# Patient Record
Sex: Male | Born: 1960 | Race: White | Hispanic: No | Marital: Married | Smoking: Former smoker
Health system: Southern US, Community
[De-identification: ages and names within clinical notes are randomized; demographics above are authoritative.]

## PROBLEM LIST (undated history)

## (undated) DIAGNOSIS — F329 Major depressive disorder, single episode, unspecified: Secondary | ICD-10-CM

## (undated) DIAGNOSIS — F32A Depression, unspecified: Secondary | ICD-10-CM

## (undated) DIAGNOSIS — F319 Bipolar disorder, unspecified: Secondary | ICD-10-CM

## (undated) HISTORY — DX: Depression, unspecified: F32.A

## (undated) HISTORY — DX: Major depressive disorder, single episode, unspecified: F32.9

---

## 2011-12-13 ENCOUNTER — Encounter: Payer: Self-pay | Admitting: *Deleted

## 2011-12-13 DIAGNOSIS — F329 Major depressive disorder, single episode, unspecified: Secondary | ICD-10-CM | POA: Insufficient documentation

## 2011-12-18 ENCOUNTER — Encounter: Payer: Self-pay | Admitting: Emergency Medicine

## 2011-12-18 ENCOUNTER — Ambulatory Visit (INDEPENDENT_AMBULATORY_CARE_PROVIDER_SITE_OTHER): Payer: BC Managed Care – PPO | Admitting: Emergency Medicine

## 2011-12-18 VITALS — BP 114/72 | HR 71 | Temp 97.9°F | Resp 16 | Ht 71.5 in | Wt 199.0 lb

## 2011-12-18 DIAGNOSIS — E049 Nontoxic goiter, unspecified: Secondary | ICD-10-CM

## 2011-12-18 DIAGNOSIS — E782 Mixed hyperlipidemia: Secondary | ICD-10-CM

## 2011-12-18 DIAGNOSIS — F329 Major depressive disorder, single episode, unspecified: Secondary | ICD-10-CM

## 2011-12-18 DIAGNOSIS — E785 Hyperlipidemia, unspecified: Secondary | ICD-10-CM

## 2011-12-18 DIAGNOSIS — E041 Nontoxic single thyroid nodule: Secondary | ICD-10-CM

## 2011-12-18 DIAGNOSIS — F339 Major depressive disorder, recurrent, unspecified: Secondary | ICD-10-CM

## 2011-12-18 LAB — LIPID PANEL
Cholesterol: 155 mg/dL (ref 0–200)
HDL: 49 mg/dL (ref >39)
LDL Cholesterol: 75 mg/dL (ref 0–99)
Triglycerides: 156 mg/dL — ABNORMAL HIGH (ref <150)

## 2011-12-18 LAB — CBC
HCT: 44 % (ref 39.0–52.0)
Platelets: 280 10*3/uL (ref 150–400)
RDW: 13 % (ref 11.5–15.5)
WBC: 5.5 10*3/uL (ref 4.0–10.5)

## 2011-12-18 MED ORDER — SIMVASTATIN 40 MG PO TABS: ORAL_TABLET | ORAL | Status: DC

## 2011-12-18 MED ORDER — RISPERIDONE 1 MG PO TABS: ORAL_TABLET | ORAL | Status: DC

## 2011-12-18 MED ORDER — DIVALPROEX SODIUM 500 MG PO DR TAB: DELAYED_RELEASE_TABLET | ORAL | Status: DC

## 2011-12-18 NOTE — Progress Notes (Signed)
**Note De-identified Mckelvie Obfuscation**  **Note De-Identified Binns Obfuscation** Subjective:    Patient ID: Jared Molina, male    DOB: Jun 14, 1961, 51 y.o.   MRN: [id number]  HPI patient is with a 6 month followup of his cholesterol. He overall is doing well without complaints    Review of Systems denies chest pain shortness of breath or any other issues at the present time.     Objective:   Physical Exam physical exam the neck is supple. There is a 1 x 1.5 cm nodular area which appears to be in the right lobe of the thyroid. Neck is otherwise supple chest clear heart regular rate no murmurs abdomen soft without tenderness.        Assessment & Plan:  Assessment is high cholesterol currently doing well on treatment. He was found to have a nodule in the area of the thyroid today and we'll check a ultrasound to delineate what this is.

## 2011-12-24 ENCOUNTER — Ambulatory Visit
Admission: RE | Admit: 2011-12-24 | Discharge: 2011-12-24 | Disposition: A | Discharge status: Home / Self Care | Payer: BC Managed Care – PPO | Source: Ambulatory Visit | Attending: Emergency Medicine | Admitting: Emergency Medicine

## 2011-12-24 ENCOUNTER — Other Ambulatory Visit: Payer: Self-pay | Admitting: Emergency Medicine

## 2011-12-24 DIAGNOSIS — E079 Disorder of thyroid, unspecified: Secondary | ICD-10-CM

## 2011-12-24 DIAGNOSIS — E041 Nontoxic single thyroid nodule: Secondary | ICD-10-CM

## 2011-12-25 ENCOUNTER — Other Ambulatory Visit (HOSPITAL_COMMUNITY)
Admission: RE | Admit: 2011-12-25 | Discharge: 2011-12-25 | Disposition: A | Discharge status: Home / Self Care | Payer: BC Managed Care – PPO | Source: Ambulatory Visit | Attending: Interventional Radiology | Admitting: Interventional Radiology

## 2011-12-25 ENCOUNTER — Other Ambulatory Visit: Payer: Self-pay | Admitting: Physician Assistant

## 2011-12-25 ENCOUNTER — Ambulatory Visit
Admission: RE | Admit: 2011-12-25 | Discharge: 2011-12-25 | Disposition: A | Discharge status: Home / Self Care | Payer: BC Managed Care – PPO | Source: Ambulatory Visit | Attending: Physician Assistant | Admitting: Physician Assistant

## 2011-12-25 ENCOUNTER — Telehealth: Payer: Self-pay | Admitting: Radiology

## 2011-12-25 DIAGNOSIS — E079 Disorder of thyroid, unspecified: Secondary | ICD-10-CM

## 2011-12-25 DIAGNOSIS — E049 Nontoxic goiter, unspecified: Secondary | ICD-10-CM | POA: Insufficient documentation

## 2011-12-25 NOTE — Telephone Encounter (Signed)
**Note De-Identified Gilkison Obfuscation** Message copied by Levon Hedger A on Tue Dec 25, 2011  9:18 AM ------      Message from: Lesle Chris A      Created: Mon Dec 24, 2011 10:31 AM       Please call patient. There is a nodule in the thyroid which needs to have a biopsy performed. We'll schedule with interventional radiology a biopsy of this lesion. If he has not heard from [place] by Thursday please contact our office so we can be sure the appointment has been made.

## 2011-12-25 NOTE — Telephone Encounter (Signed)
**Note De-Identified Albanese Obfuscation** INFORMED PT OF RESULTS. TOLD HIM WE WILL BE GETTING AN APPT MADE FOR HIM FOR BIOPSY.

## 2012-01-02 ENCOUNTER — Telehealth: Payer: Self-pay

## 2012-01-02 NOTE — Telephone Encounter (Signed)
**Note De-Identified Rotan Obfuscation** Thyroid biopsy result not in yet.  Patient notified that it can sometimes take 1-2 weeks for result, but we will notify him as soon as MD reviews.

## 2012-01-02 NOTE — Telephone Encounter (Signed)
**Note De-Identified Knudsen Obfuscation** PT STATES HE HAD A THYROID TEST DONE LAST WEEK AND WAS HOPING HE WOULD HAVE HEARD FROM SOMEONE BY NOW PLEASE CALL [phone]

## 2012-01-09 NOTE — Telephone Encounter (Signed)
**Note De-Identified Lumpkin Obfuscation** Pt called he is still waiting on results from his biopsy, he called yesterday as well and would like a return phone call form Dr Cleta Alberts,

## 2012-01-10 NOTE — Telephone Encounter (Signed)
**Note De-Identified Vlcek Obfuscation** Gave pt results and f/up instr's from Dr Cleta Alberts - see notes under lab results 4/2 biopsy. Pt agreed

## 2012-04-28 ENCOUNTER — Encounter: Payer: Self-pay | Admitting: Emergency Medicine

## 2012-06-17 ENCOUNTER — Ambulatory Visit: Payer: BC Managed Care – PPO | Admitting: Emergency Medicine

## 2012-08-26 ENCOUNTER — Encounter: Payer: Self-pay | Admitting: Emergency Medicine

## 2012-08-26 ENCOUNTER — Ambulatory Visit (INDEPENDENT_AMBULATORY_CARE_PROVIDER_SITE_OTHER): Payer: BC Managed Care – PPO | Admitting: Emergency Medicine

## 2012-08-26 ENCOUNTER — Ambulatory Visit: Payer: BC Managed Care – PPO

## 2012-08-26 VITALS — BP 110/72 | HR 71 | Temp 98.0°F | Resp 16 | Ht 71.5 in | Wt 205.2 lb

## 2012-08-26 DIAGNOSIS — E041 Nontoxic single thyroid nodule: Secondary | ICD-10-CM | POA: Insufficient documentation

## 2012-08-26 DIAGNOSIS — Z0000 Encounter for general adult medical examination without abnormal findings: Secondary | ICD-10-CM

## 2012-08-26 DIAGNOSIS — Z125 Encounter for screening for malignant neoplasm of prostate: Secondary | ICD-10-CM

## 2012-08-26 DIAGNOSIS — Z139 Encounter for screening, unspecified: Secondary | ICD-10-CM

## 2012-08-26 DIAGNOSIS — E785 Hyperlipidemia, unspecified: Secondary | ICD-10-CM | POA: Insufficient documentation

## 2012-08-26 DIAGNOSIS — Z9089 Acquired absence of other organs: Secondary | ICD-10-CM

## 2012-08-26 DIAGNOSIS — F17200 Nicotine dependence, unspecified, uncomplicated: Secondary | ICD-10-CM | POA: Insufficient documentation

## 2012-08-26 DIAGNOSIS — G4700 Insomnia, unspecified: Secondary | ICD-10-CM

## 2012-08-26 DIAGNOSIS — F329 Major depressive disorder, single episode, unspecified: Secondary | ICD-10-CM

## 2012-08-26 DIAGNOSIS — Z23 Encounter for immunization: Secondary | ICD-10-CM

## 2012-08-26 DIAGNOSIS — F1020 Alcohol dependence, uncomplicated: Secondary | ICD-10-CM | POA: Insufficient documentation

## 2012-08-26 LAB — POCT URINALYSIS DIPSTICK
Bilirubin, UA: NEGATIVE
Glucose, UA: NEGATIVE
Ketones, UA: NEGATIVE
Spec Grav, UA: >=1.03

## 2012-08-26 LAB — COMPREHENSIVE METABOLIC PANEL
Albumin: 4.3 g/dL (ref 3.5–5.2)
BUN: 12 mg/dL (ref 6–23)
Calcium: 9.1 mg/dL (ref 8.4–10.5)
Chloride: 104 mEq/L (ref 96–112)
Glucose, Bld: 83 mg/dL (ref 70–99)
Potassium: 4.6 mEq/L (ref 3.5–5.3)
Total Protein: 6.7 g/dL (ref 6.0–8.3)

## 2012-08-26 LAB — POCT UA - MICROSCOPIC ONLY
Bacteria, U Microscopic: NEGATIVE
Crystals, Ur, HPF, POC: NEGATIVE
RBC, urine, microscopic: NEGATIVE

## 2012-08-26 LAB — LIPID PANEL
Cholesterol: 145 mg/dL (ref 0–200)
LDL Cholesterol: 49 mg/dL (ref 0–99)
Triglycerides: 254 mg/dL — ABNORMAL HIGH (ref <150)

## 2012-08-26 LAB — CBC
HCT: 43.5 % (ref 39.0–52.0)
Hemoglobin: 14.9 g/dL (ref 13.0–17.0)
RBC: 4.81 MIL/uL (ref 4.22–5.81)
WBC: 5 10*3/uL (ref 4.0–10.5)

## 2012-08-26 LAB — IFOBT (OCCULT BLOOD): IFOBT: NEGATIVE

## 2012-08-26 MED ORDER — RISPERIDONE 1 MG PO TABS: ORAL_TABLET | ORAL | Status: DC

## 2012-08-26 MED ORDER — DIVALPROEX SODIUM 500 MG PO DR TAB: DELAYED_RELEASE_TABLET | ORAL | Status: DC

## 2012-08-26 MED ORDER — SIMVASTATIN 40 MG PO TABS: ORAL_TABLET | ORAL | Status: DC

## 2012-08-26 NOTE — Progress Notes (Signed)
**Note De-Identified Fusilier Obfuscation** @[facility] @  Patient ID: Jared Molina MRN: [id number], DOB: 1961-02-06 51 y.o. Date of Encounter: 08/26/2012, 8:15 AM  Primary Physician: No primary provider on file.  Chief Complaint: Physical (CPE)  HPI: 51 y.o. y/o male with history noted below here for CPE.  Doing well. No issues/complaints.  Review of Systems:  Consitutional: No fever, chills, fatigue, night sweats, lymphadenopathy,weight increasing . Eyes: No visual changes, eye redness, or discharge. ENT/Mouth: Ears: No otalgia, tinnitus, hearing loss, discharge. Nose: No congestion, rhinorrhea, sinus pain, or epistaxis. Throat: No sore throat, post nasal drip, or teeth pain. Cardiovascular: No CP, palpitations, diaphoresis, DOE, edema, orthopnea, PND. Respiratory: No cough, hemoptysis, SOB, or wheezing. Gastrointestinal: No anorexia, dysphagia, reflux, pain, nausea, vomiting, hematemesis, diarrhea, constipation, BRBPR, or melena. Genitourinary: No dysuria, frequency, urgency, hematuria, incontinence, nocturia, decreased urinary stream, discharge, impotence, or testicular pain/masses. Musculoskeletal: No decreased ROM, myalgias, stiffness, joint swelling, or weakness. Skin: No rash, erythema, lesion changes, pain, warmth, jaundice, or pruritis. Neurological: No headache, dizziness, syncope, seizures, tremors, memory loss, coordination problems, or paresthesias. Psychological: No anxiety, depression, hallucinations, SI/HI. Endocrine: No fatigue, polydipsia, polyphagia, polyuria, or known diabetes. All other systems were reviewed and are otherwise negative.  Past Medical History  Diagnosis Date  . Depression      No past surgical history on file.  Home Meds:  Prior to Admission medications   Medication Sig Start Date End Date Taking? Authorizing Provider  divalproex (DEPAKOTE) 500 MG DR tablet Take one tablet at bedtime. 12/18/11  Yes Collene Gobble, MD  risperiDONE (RISPERDAL) 1 MG tablet 1 tablet at bedtime 12/18/11  Yes Collene Gobble, MD  simvastatin (ZOCOR) 40 MG tablet Take one tablet by mouth every evening 12/18/11  Yes Collene Gobble, MD    Allergies: No Known Allergies  History   Social History  . Marital Status: Married    Spouse Name: N/A    Number of Children: N/A  . Years of Education: N/A   Occupational History  . Not on file.   Social History Main Topics  . Smoking status: Current Every Day Smoker  . Smokeless tobacco: Not on file  . Alcohol Use: Yes  . Drug Use: No  . Sexually Active: Not on file   Other Topics Concern  . Not on file   Social History Narrative  . No narrative on file    No family history on file.  Physical Exam:  Blood pressure 110/72, pulse 71, temperature 98 F (36.7 C), temperature source Oral, resp. rate 16, height 5' 11.5" (1.816 m), weight 205 lb 3.2 oz (93.078 kg), SpO2 97.00%.  General: Well developed, well nourished, in no acute distress. HEENT: Normocephalic, atraumatic. Conjunctiva pink, sclera non-icteric. Pupils 2 mm constricting to 1 mm, round, regular, and equally reactive to light and accomodation. EOMI. Internal auditory canal clear. The right TM is scarred with a pigmented area present in the superior portion of the drum. He's had a mastoidectomy on the right previously . Nasal mucosa pink. Nares are without discharge. No sinus tenderness. Oral mucosa pink. Dentition did not note any thyromegaly or thyroid nodules today  . Pharynx without exudate.   Neck: Supple. Trachea midline. No thyromegaly. Full ROM. No lymphadenopathy. Lungs: Clear to auscultation bilaterally without wheezes, rales, or rhonchi. Breathing is of normal effort and unlabored. Cardiovascular: RRR with S1 S2. No murmurs, rubs, or gallops appreciated. Distal pulses 2+ symmetrically. No carotid or abdominal bruits.  Abdomen: Soft, non-tender, non-distended with normoactive bowel sounds. No hepatosplenomegaly or masses. **Note De-Identified Dooling Obfuscation** No rebound/guarding. No CVA tenderness. Without hernias.  Rectal:  No external hemorrhoids or fissures. Rectal vault without masses.   Genitourinary:   circumcised male. No penile lesions. Testes descended bilaterally, and smooth without tenderness or masses.  Musculoskeletal: Full range of motion and 5/5 strength throughout. Without swelling, atrophy, tenderness, crepitus, or warmth. Extremities without clubbing, cyanosis, or edema. Calves supple. Skin: Warm and moist without erythema, ecchymosis, wounds, or rash. Neuro: A+Ox3. CN II-XII grossly intact. Moves all extremities spontaneously. Full sensation throughout. Normal gait. DTR 2+ throughout upper and lower extremities. Finger to nose intact. Psych:  Responds to questions appropriately with a normal affect.   Studies: CBC, CMET, Lipid, PSA, TSH,     Assessment/Plan:  51 y.o. y/o   male here for CPE he recently saw Dr. Jacinto Halim and had a stress test done last year.   . 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 He is currently on medications for depression and for high cholesterol. He continues to be a smoker he is trying to cut back on this some. He continues to drink more than he would like. He is trying to cut back and drink only on the weekends. He's been to AA in the past but not recently and does not really want to go back again . He declines a flu shot this year. He will consider shingles vaccine. He is agreeable to have a colonoscopy ordered [facility] reading (PRIMARY) by  Dr Cleta Alberts please comment on lateral to see if there is any abnormality noted anterior chest.   -  Signed, Earl Lites, MD 08/26/2012 8:15 AM

## 2012-11-18 ENCOUNTER — Other Ambulatory Visit: Payer: Self-pay | Admitting: Emergency Medicine

## 2012-12-19 ENCOUNTER — Other Ambulatory Visit: Payer: Self-pay | Admitting: Emergency Medicine

## 2013-09-20 ENCOUNTER — Other Ambulatory Visit: Payer: Self-pay | Admitting: Emergency Medicine

## 2013-10-19 ENCOUNTER — Telehealth: Payer: Self-pay | Admitting: Family Medicine

## 2013-10-19 MED ORDER — SIMVASTATIN 40 MG PO TABS: 40.0000 mg | ORAL_TABLET | Freq: Every day | ORAL | Status: DC

## 2013-10-19 MED ORDER — RISPERIDONE 1 MG PO TABS: 1.0000 mg | ORAL_TABLET | Freq: Every day | ORAL | Status: DC

## 2013-10-19 MED ORDER — DIVALPROEX SODIUM 500 MG PO DR TAB: 500.0000 mg | DELAYED_RELEASE_TABLET | Freq: Every day | ORAL | Status: DC

## 2013-10-19 NOTE — Telephone Encounter (Signed)
**Note De-Identified Fugere Obfuscation** Patient has appt 10/27/13 with Dr Cleta Albertsaub. Will send in 10 day supply.

## 2013-10-27 ENCOUNTER — Ambulatory Visit (INDEPENDENT_AMBULATORY_CARE_PROVIDER_SITE_OTHER): Payer: BC Managed Care – PPO | Admitting: Emergency Medicine

## 2013-10-27 ENCOUNTER — Encounter: Payer: Self-pay | Admitting: Emergency Medicine

## 2013-10-27 VITALS — BP 132/76 | HR 75 | Temp 98.9°F | Resp 16 | Ht 71.0 in | Wt 196.0 lb

## 2013-10-27 DIAGNOSIS — F329 Major depressive disorder, single episode, unspecified: Secondary | ICD-10-CM

## 2013-10-27 DIAGNOSIS — F3289 Other specified depressive episodes: Secondary | ICD-10-CM

## 2013-10-27 DIAGNOSIS — E041 Nontoxic single thyroid nodule: Secondary | ICD-10-CM

## 2013-10-27 DIAGNOSIS — F32A Depression, unspecified: Secondary | ICD-10-CM

## 2013-10-27 MED ORDER — RISPERIDONE 1 MG PO TABS: ORAL_TABLET | ORAL | Status: DC

## 2013-10-27 MED ORDER — SIMVASTATIN 40 MG PO TABS: ORAL_TABLET | ORAL | Status: DC

## 2013-10-27 MED ORDER — DIVALPROEX SODIUM 500 MG PO DR TAB: DELAYED_RELEASE_TABLET | ORAL | Status: DC

## 2013-10-27 NOTE — Progress Notes (Signed)
**Note De-identified Aydelott Obfuscation**   **Note De-Identified Mandujano Obfuscation** Subjective:    Patient ID: Jared Molina, male    DOB: 01-29-1961, 53 y.o.   MRN: [id number] This chart was scribed for Collene GobbleSteven A Eyan Hagood, MD by Danella Maiersaroline Early, ED Scribe. This patient was seen in room 21 and the patient's care was started at 2:33 PM.  Chief Complaint  Patient presents with  . Medication Refill    Depakote, Zocor, and Risperdal   HPI HPI Comments: Jared Molina is a 53 y.o. male who presents to the [facility] for a medication refill on his Depakote, Zocor, and Risperdal. He has been on these medications for 10 years. He has an upcoming appt here for a full physical. He does not want the flu shot. He was set up for an ultrasound and biopsy of a thyroid nodule 2 years ago but did not keep the appointment because of financial reasons.   He went to [place] and [place] in October on vacation. He works for a [employer], [employer] company.   Past Medical History  Diagnosis Date  . Depression    Current Outpatient Prescriptions on File Prior to Visit  Medication Sig Dispense Refill  . divalproex (DEPAKOTE) 500 MG DR tablet Take 1 tablet (500 mg total) by mouth at bedtime. PATIENT NEEDS OFFICE VISIT FOR ADDITIONAL REFILLS  10 tablet  0  . risperiDONE (RISPERDAL) 1 MG tablet Take 1 tablet (1 mg total) by mouth at bedtime. PATIENT NEEDS OFFICE VISIT FOR ADDITIONAL REFILLS  10 tablet  0  . simvastatin (ZOCOR) 40 MG tablet Take 1 tablet (40 mg total) by mouth daily. PATIENT NEEDS OFFICE VISIT FOR ADDITIONAL REFILLS  10 tablet  0   No current facility-administered medications on file prior to visit.   No Known Allergies    Review of Systems  Constitutional: Negative for fatigue and unexpected weight change.  Eyes: Negative for visual disturbance.  Respiratory: Negative for cough, chest tightness and shortness of breath.   Cardiovascular: Negative for chest pain, palpitations and leg swelling.  Gastrointestinal: Negative for abdominal pain and blood in stool.    Neurological: Negative for dizziness, light-headedness and headaches.       Objective:   Physical Exam CONSTITUTIONAL: Well developed/well nourished HEAD: Normocephalic/atraumatic EYES: EOMI/PERRL ENMT: Mucous membranes moist NECK: supple no meningeal signs SPINE:entire spine nontender CV: S1/S2 noted, no murmurs/rubs/gallops noted LUNGS: Lungs are clear to auscultation bilaterally, no apparent distress ABDOMEN: soft, nontender, no rebound or guarding GU:no cva tenderness NEURO: Pt is awake/alert, moves all extremitiesx4 EXTREMITIES: pulses normal, full ROM SKIN: warm, color normal PSYCH: no abnormalities of mood noted  Filed Vitals:   10/27/13 1443  BP: 132/76  Pulse: 75  Temp: 98.9 F (37.2 C)  TempSrc: Oral  Resp: 16  Height: 5\' 11"  (1.803 m)  Weight: 196 lb (88.905 kg)  SpO2: 98%         Assessment & Plan:  No diagnosis found. Patient needs meds refilled for his anxiety depression and for high cholesterol. He was scheduled for a thyroid biopsy but did not have it done. We'll repeat his thyroid ultrasound to check stability. He is scheduled for a physical exam in April.  I personally performed the services described in this documentation, which was scribed in my presence. The recorded information has been reviewed and is accurate.

## 2013-11-04 ENCOUNTER — Other Ambulatory Visit: Payer: BC Managed Care – PPO

## 2013-11-11 ENCOUNTER — Other Ambulatory Visit: Payer: BC Managed Care – PPO

## 2013-12-01 ENCOUNTER — Ambulatory Visit
Admission: RE | Admit: 2013-12-01 | Discharge: 2013-12-01 | Disposition: A | Discharge status: Home / Self Care | Payer: BC Managed Care – PPO | Source: Ambulatory Visit | Attending: Emergency Medicine | Admitting: Emergency Medicine

## 2013-12-01 DIAGNOSIS — E041 Nontoxic single thyroid nodule: Secondary | ICD-10-CM

## 2013-12-29 ENCOUNTER — Ambulatory Visit (INDEPENDENT_AMBULATORY_CARE_PROVIDER_SITE_OTHER): Payer: BC Managed Care – PPO | Admitting: Emergency Medicine

## 2013-12-29 ENCOUNTER — Ambulatory Visit: Payer: BC Managed Care – PPO

## 2013-12-29 ENCOUNTER — Encounter: Payer: Self-pay | Admitting: Emergency Medicine

## 2013-12-29 VITALS — BP 108/66 | HR 61 | Temp 98.5°F | Resp 16 | Ht 71.0 in | Wt 195.2 lb

## 2013-12-29 DIAGNOSIS — F3289 Other specified depressive episodes: Secondary | ICD-10-CM

## 2013-12-29 DIAGNOSIS — F17200 Nicotine dependence, unspecified, uncomplicated: Secondary | ICD-10-CM

## 2013-12-29 DIAGNOSIS — F329 Major depressive disorder, single episode, unspecified: Secondary | ICD-10-CM

## 2013-12-29 DIAGNOSIS — Z0000 Encounter for general adult medical examination without abnormal findings: Secondary | ICD-10-CM

## 2013-12-29 DIAGNOSIS — F32A Depression, unspecified: Secondary | ICD-10-CM

## 2013-12-29 DIAGNOSIS — Z23 Encounter for immunization: Secondary | ICD-10-CM

## 2013-12-29 DIAGNOSIS — R634 Abnormal weight loss: Secondary | ICD-10-CM

## 2013-12-29 LAB — COMPLETE METABOLIC PANEL WITH GFR
ALT: 30 U/L (ref 0–53)
AST: 26 U/L (ref 0–37)
Albumin: 4.5 g/dL (ref 3.5–5.2)
Alkaline Phosphatase: 39 U/L (ref 39–117)
BILIRUBIN TOTAL: 0.6 mg/dL (ref 0.2–1.2)
BUN: 11 mg/dL (ref 6–23)
CO2: 28 meq/L (ref 19–32)
CREATININE: 0.87 mg/dL (ref 0.50–1.35)
Calcium: 9.5 mg/dL (ref 8.4–10.5)
Chloride: 103 mEq/L (ref 96–112)
GLUCOSE: 93 mg/dL (ref 70–99)
Potassium: 5.2 mEq/L (ref 3.5–5.3)
Sodium: 140 mEq/L (ref 135–145)
Total Protein: 6.7 g/dL (ref 6.0–8.3)

## 2013-12-29 LAB — CBC WITH DIFFERENTIAL/PLATELET
Basophils Absolute: 0 10*3/uL (ref 0.0–0.1)
Basophils Relative: 0 % (ref 0–1)
EOS ABS: 0 10*3/uL (ref 0.0–0.7)
EOS PCT: 1 % (ref 0–5)
HCT: 42.5 % (ref 39.0–52.0)
HEMOGLOBIN: 14.9 g/dL (ref 13.0–17.0)
LYMPHS ABS: 1.2 10*3/uL (ref 0.7–4.0)
Lymphocytes Relative: 31 % (ref 12–46)
MCH: 30.9 pg (ref 26.0–34.0)
MCHC: 35.1 g/dL (ref 30.0–36.0)
MCV: 88.2 fL (ref 78.0–100.0)
MONOS PCT: 13 % — AB (ref 3–12)
Monocytes Absolute: 0.5 10*3/uL (ref 0.1–1.0)
Neutro Abs: 2.1 10*3/uL (ref 1.7–7.7)
Neutrophils Relative %: 55 % (ref 43–77)
PLATELETS: 289 10*3/uL (ref 150–400)
RBC: 4.82 MIL/uL (ref 4.22–5.81)
RDW: 13.8 % (ref 11.5–15.5)
WBC: 3.9 10*3/uL — ABNORMAL LOW (ref 4.0–10.5)

## 2013-12-29 LAB — IFOBT (OCCULT BLOOD): IFOBT: NEGATIVE

## 2013-12-29 LAB — POCT URINALYSIS DIPSTICK
Bilirubin, UA: NEGATIVE
GLUCOSE UA: NEGATIVE
Ketones, UA: NEGATIVE
Leukocytes, UA: NEGATIVE
Nitrite, UA: NEGATIVE
PH UA: 7
RBC UA: NEGATIVE
SPEC GRAV UA: 1.015
UROBILINOGEN UA: 0.2

## 2013-12-29 LAB — LIPID PANEL
CHOLESTEROL: 147 mg/dL (ref 0–200)
HDL: 52 mg/dL (ref >39)
LDL CALC: 73 mg/dL (ref 0–99)
TRIGLYCERIDES: 110 mg/dL (ref <150)
Total CHOL/HDL Ratio: 2.8 Ratio
VLDL: 22 mg/dL (ref 0–40)

## 2013-12-29 MED ORDER — ZOSTER VACCINE LIVE 19400 UNT/0.65ML ~~LOC~~ SOLR: 0.6500 mL | Freq: Once | SUBCUTANEOUS | Status: DC

## 2013-12-29 NOTE — Progress Notes (Signed)
**Note De-identified Roa Obfuscation**  **Note De-Identified Thayer Obfuscation** This chart was scribed for Collene GobbleSteven A Jalysa Swopes, MD by Joaquin MusicKristina Sanchez-Matthews, ED Scribe. This patient was seen in room Room/bed 21 and the patient's care was started at 2:58 PM. Subjective:    Patient ID: Jared Inglesavid Molina, male    DOB: Jun 26, 1961, 53 y.o.   MRN: [id number] Chief Complaint  Patient presents with  . Annual Exam   HPI Jared InglesDavid Molina is a 53 y.o. male with a hx of depression who [facility] complaining of annual exam. Pt states his health has been improving since beginning weight Watchers January 2014. He states he lost about 20 lbs last year but gained 8 lbs over the holidays. He states he has a goal "to be 180-lbs". Pt states he is walking as well. He states he quit smoking the pipe and states he feels his health is improving. Pt reports to be working on his diet. He reports to be drinking 1-2 alcoholic beverages/beers x day. Pt states his last coloscopy was December 2014 and results WNL. 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 He states hearing loss "gets frustrating".Pt is being seen by Dr. Donnal Moatroust. Pts last tetanus was September 2012. He is unsure if he has had the shingles vaccine.  He reports to have his depression affect him in season. Pt states last year was the first time he took a week and a half off from work and states this helped with his depression.Pt is taking Depakote, Risperdal, and Zocor suggestive of pt being seen by psychiatrist.    Review of Systems  HENT: Positive for hearing loss.   Psychiatric/Behavioral: Positive for dysphoric mood.  All other systems reviewed and are negative.   Objective:   Physical Exam CONSTITUTIONAL: Well developed/well nourished. Flat affect.  HEAD: Normocephalic/atraumatic EYES: EOMI/PERRL ENMT: Mucous membranes moist. Scar over the R mastoid with distortion of the R TM structure. NECK: supple no meningeal signs SPINE:entire spine nontender CV: S1/S2 noted, no  murmurs/rubs/gallops noted LUNGS: Lungs are clear to auscultation bilaterally, no apparent distress ABDOMEN: soft, nontender, no rebound or guarding GU:no cva tenderness NEURO: Pt is awake/alert, moves all extremitiesx4 EXTREMITIES: pulses normal, full ROM SKIN: warm, color normal PSYCH: no abnormalities of mood noted  EKG: normal EKG, normal sinus rhythm.  [facility] preliminary x-ray report read by Dr. Lesle ChrisSteven Jashley Yellin: 3 x 1 cm wedge shaped infiltrate at R lower lobe. Please comment on. Need for F/U.  Triage Vitals:BP 108/66  Pulse 61  Temp(Src) 98.5 F (36.9 C) (Oral)  Resp 16  Ht 5\' 11"  (1.803 m)  Wt 195 lb 3.2 oz (88.542 kg)  BMI 27.24 kg/m2  SpO2 97% Assessment & Plan:  There is a questionable abnormality in the right lower lobe. Advise the patient at a minimum we would re\re x-ray this in 6 months. I'm awaiting the radiology reading on this area. Routine labs were done. He was given a prescription for shingles vaccine. No changes were made in his medications. He is going to decide if he wants surgery on his middle ear with Dr. Dorma RussellKraus. He will also need a followup ultrasound of his thyroid he has also had a thorough checkup with Dr. Jacinto HalimGanji. I personally performed the services described in this documentation, which was scribed in my presence. The recorded information has been reviewed and is accurate.

## 2013-12-30 LAB — PSA: PSA: 0.66 ng/mL (ref <=4.00)

## 2013-12-30 LAB — TSH: TSH: 1.907 u[IU]/mL (ref 0.350–4.500)

## 2014-01-02 ENCOUNTER — Telehealth: Payer: Self-pay | Admitting: Emergency Medicine

## 2014-01-02 NOTE — Telephone Encounter (Signed)
**Note De-Identified Bumpus Obfuscation** Cxr results left on voice mail

## 2014-01-05 ENCOUNTER — Encounter: Payer: Self-pay | Admitting: Radiology

## 2014-03-16 ENCOUNTER — Ambulatory Visit (INDEPENDENT_AMBULATORY_CARE_PROVIDER_SITE_OTHER): Payer: BC Managed Care – PPO | Admitting: Emergency Medicine

## 2014-03-16 ENCOUNTER — Encounter: Payer: Self-pay | Admitting: Emergency Medicine

## 2014-03-16 ENCOUNTER — Ambulatory Visit (INDEPENDENT_AMBULATORY_CARE_PROVIDER_SITE_OTHER): Payer: BC Managed Care – PPO

## 2014-03-16 VITALS — BP 126/75 | HR 75 | Temp 98.8°F | Resp 16 | Ht 71.25 in | Wt 195.8 lb

## 2014-03-16 DIAGNOSIS — R9389 Abnormal findings on diagnostic imaging of other specified body structures: Secondary | ICD-10-CM

## 2014-03-16 NOTE — Progress Notes (Signed)
**Note De-identified Anstey Obfuscation**   **Note De-Identified Kirkendoll Obfuscation** Subjective:    Patient ID: Jared Molina, male    DOB: [birth year], 53 y.o.   MRN: [id number]  HPI patient followup abnormal chest x-ray. He had some atelectatic areas in the right base and I requested he return to clinic today for reevaluation. He feels fine without complaints the    Review of Systems     Objective:   Physical Exam HEENT exam is unremarkable neck supple chest was clear to both auscultation and percussion [facility] reading (PRIMARY) by  Dr Cleta Albertsaub chest x-ray shows no acute disease no abnormal areas        Assessment & Plan:  Chest x-ray sent for radiology opinion. Depending on the findings we'll decide on need for further followup.

## 2014-09-29 ENCOUNTER — Other Ambulatory Visit: Payer: Self-pay | Admitting: Emergency Medicine

## 2014-09-30 NOTE — Telephone Encounter (Signed)
**Note De-Identified Jared Molina** Dr Cleta Albertsaub, pt has CPE scheduled w/you on 12/30/14. Do you want to OK RFs of these until then? Pended.

## 2014-12-21 ENCOUNTER — Other Ambulatory Visit: Payer: Self-pay | Admitting: Emergency Medicine

## 2014-12-22 ENCOUNTER — Other Ambulatory Visit: Payer: Self-pay | Admitting: Emergency Medicine

## 2014-12-27 ENCOUNTER — Telehealth: Payer: Self-pay | Admitting: Family Medicine

## 2014-12-27 NOTE — Telephone Encounter (Signed)
**Note De-Identified Waldren Obfuscation** called patient to moved his appt to 12/31/14

## 2014-12-30 ENCOUNTER — Encounter: Payer: BC Managed Care – PPO | Admitting: Emergency Medicine

## 2014-12-31 ENCOUNTER — Encounter: Payer: Self-pay | Admitting: Emergency Medicine

## 2015-01-18 ENCOUNTER — Telehealth: Payer: Self-pay

## 2015-01-18 MED ORDER — DIVALPROEX SODIUM 500 MG PO DR TAB: 500.0000 mg | DELAYED_RELEASE_TABLET | Freq: Every day | ORAL | Status: DC

## 2015-01-18 MED ORDER — SIMVASTATIN 40 MG PO TABS: 40.0000 mg | ORAL_TABLET | Freq: Every day | ORAL | Status: DC

## 2015-01-18 MED ORDER — RISPERIDONE 1 MG PO TABS: 1.0000 mg | ORAL_TABLET | Freq: Every day | ORAL | Status: DC

## 2015-01-18 NOTE — Telephone Encounter (Signed)
**Note De-identified Mussell Obfuscation** Can we refill meds

## 2015-01-18 NOTE — Telephone Encounter (Signed)
**Note De-Identified Swartout Obfuscation** Left message on pt's voicemail, advised Rx's sent to the pharmacy.

## 2015-01-18 NOTE — Telephone Encounter (Signed)
**Note De-Identified Ballo Obfuscation** I will refill this for him. Patient had normal Cmet in early 12/2014. Please let him know prescriptions were sent electronically to his pharmacy.

## 2015-01-18 NOTE — Telephone Encounter (Signed)
**Note De-Identified Paschen Obfuscation** Pt states he have an appt with Dr.Daub in July but will run out of his DEPAKOTE 500 MG, RISPERDAL 1 MG AND ZOCOR 40 MG. Would like to have enough until then Please call [phone]     [facility] ON [address]

## 2015-01-21 ENCOUNTER — Other Ambulatory Visit: Payer: Self-pay | Admitting: Emergency Medicine

## 2015-03-25 ENCOUNTER — Other Ambulatory Visit: Payer: Self-pay | Admitting: Urgent Care

## 2015-04-05 ENCOUNTER — Ambulatory Visit (INDEPENDENT_AMBULATORY_CARE_PROVIDER_SITE_OTHER): Payer: 59 | Admitting: Emergency Medicine

## 2015-04-05 ENCOUNTER — Encounter: Payer: Self-pay | Admitting: Emergency Medicine

## 2015-04-05 VITALS — BP 121/75 | HR 71 | Temp 97.9°F | Resp 16 | Ht 72.5 in | Wt 199.0 lb

## 2015-04-05 DIAGNOSIS — F329 Major depressive disorder, single episode, unspecified: Secondary | ICD-10-CM

## 2015-04-05 DIAGNOSIS — E785 Hyperlipidemia, unspecified: Secondary | ICD-10-CM | POA: Diagnosis not present

## 2015-04-05 DIAGNOSIS — Z7289 Other problems related to lifestyle: Secondary | ICD-10-CM | POA: Diagnosis not present

## 2015-04-05 DIAGNOSIS — F32A Depression, unspecified: Secondary | ICD-10-CM

## 2015-04-05 DIAGNOSIS — Z1339 Encounter for screening examination for other mental health and behavioral disorders: Secondary | ICD-10-CM

## 2015-04-05 DIAGNOSIS — Z720 Tobacco use: Secondary | ICD-10-CM | POA: Diagnosis not present

## 2015-04-05 DIAGNOSIS — E041 Nontoxic single thyroid nodule: Secondary | ICD-10-CM

## 2015-04-05 DIAGNOSIS — F17200 Nicotine dependence, unspecified, uncomplicated: Secondary | ICD-10-CM

## 2015-04-05 DIAGNOSIS — Z0000 Encounter for general adult medical examination without abnormal findings: Secondary | ICD-10-CM

## 2015-04-05 DIAGNOSIS — Z1389 Encounter for screening for other disorder: Secondary | ICD-10-CM | POA: Diagnosis not present

## 2015-04-05 LAB — POCT UA - MICROSCOPIC ONLY
CASTS, UR, LPF, POC: NEGATIVE
Crystals, Ur, HPF, POC: NEGATIVE
Epithelial cells, urine per micros: NEGATIVE
Mucus, UA: POSITIVE
WBC, Ur, HPF, POC: NEGATIVE
Yeast, UA: NEGATIVE

## 2015-04-05 LAB — POCT URINALYSIS DIPSTICK
Bilirubin, UA: NEGATIVE
Blood, UA: NEGATIVE
GLUCOSE UA: NEGATIVE
Ketones, UA: 15
Leukocytes, UA: NEGATIVE
Nitrite, UA: NEGATIVE
PROTEIN UA: NEGATIVE
Spec Grav, UA: 1.015
UROBILINOGEN UA: 0.2
pH, UA: 7

## 2015-04-05 LAB — TSH: TSH: 1.805 u[IU]/mL (ref 0.350–4.500)

## 2015-04-05 LAB — CBC WITH DIFFERENTIAL/PLATELET
BASOS PCT: 0 % (ref 0–1)
Basophils Absolute: 0 10*3/uL (ref 0.0–0.1)
Eosinophils Absolute: 0 10*3/uL (ref 0.0–0.7)
Eosinophils Relative: 0 % (ref 0–5)
HEMATOCRIT: 45.3 % (ref 39.0–52.0)
HEMOGLOBIN: 15.4 g/dL (ref 13.0–17.0)
Lymphocytes Relative: 34 % (ref 12–46)
Lymphs Abs: 1.5 10*3/uL (ref 0.7–4.0)
MCH: 30.7 pg (ref 26.0–34.0)
MCHC: 34 g/dL (ref 30.0–36.0)
MCV: 90.4 fL (ref 78.0–100.0)
MONO ABS: 0.4 10*3/uL (ref 0.1–1.0)
MPV: 10.4 fL (ref 8.6–12.4)
Monocytes Relative: 9 % (ref 3–12)
NEUTROS PCT: 57 % (ref 43–77)
Neutro Abs: 2.6 10*3/uL (ref 1.7–7.7)
Platelets: 249 10*3/uL (ref 150–400)
RBC: 5.01 MIL/uL (ref 4.22–5.81)
RDW: 13.4 % (ref 11.5–15.5)
WBC: 4.5 10*3/uL (ref 4.0–10.5)

## 2015-04-05 LAB — COMPLETE METABOLIC PANEL WITH GFR
ALK PHOS: 46 U/L (ref 39–117)
ALT: 26 U/L (ref 0–53)
AST: 16 U/L (ref 0–37)
Albumin: 4.3 g/dL (ref 3.5–5.2)
BILIRUBIN TOTAL: 0.6 mg/dL (ref 0.2–1.2)
BUN: 16 mg/dL (ref 6–23)
CALCIUM: 9.4 mg/dL (ref 8.4–10.5)
CO2: 21 mEq/L (ref 19–32)
CREATININE: 0.86 mg/dL (ref 0.50–1.35)
Chloride: 101 mEq/L (ref 96–112)
GFR, Est African American: >89 mL/min
GFR, Est Non African American: >89 mL/min
Glucose, Bld: 89 mg/dL (ref 70–99)
POTASSIUM: 4.7 meq/L (ref 3.5–5.3)
SODIUM: 134 meq/L — AB (ref 135–145)
Total Protein: 6.8 g/dL (ref 6.0–8.3)

## 2015-04-05 LAB — LIPID PANEL
CHOL/HDL RATIO: 2.8 ratio
Cholesterol: 139 mg/dL (ref 0–200)
HDL: 50 mg/dL (ref >=40)
LDL CALC: 64 mg/dL (ref 0–99)
Triglycerides: 123 mg/dL (ref <150)
VLDL: 25 mg/dL (ref 0–40)

## 2015-04-05 MED ORDER — SIMVASTATIN 40 MG PO TABS: ORAL_TABLET | ORAL | Status: DC

## 2015-04-05 MED ORDER — RISPERIDONE 1 MG PO TABS: ORAL_TABLET | ORAL | Status: DC

## 2015-04-05 MED ORDER — DIVALPROEX SODIUM 500 MG PO DR TAB: DELAYED_RELEASE_TABLET | ORAL | Status: DC

## 2015-04-05 NOTE — Patient Instructions (Signed)
**Note De-identified [place]k Obfuscation** Health Maintenance A healthy lifestyle and preventative care can promote health and wellness.  Maintain regular health, dental, and eye exams.  Eat a healthy diet. Foods like vegetables, fruits, whole grains, low-fat dairy products, and lean protein foods contain the nutrients you need and are low in calories. Decrease your intake of foods high in solid fats, added sugars, and salt. Get information about a proper diet from your health care provider, if necessary.  Regular physical exercise is one of the most important things you can do for your health. Most adults should get at least 150 minutes of moderate-intensity exercise (any activity that increases your heart rate and causes you to sweat) each week. In addition, most adults need muscle-strengthening exercises on 2 or more days a week.   Maintain a healthy weight. The body mass index (BMI) is a screening tool to identify possible weight problems. It provides an estimate of body fat based on height and weight. Your health care provider can find your BMI and can help you achieve or maintain a healthy weight. For males 20 years and older:  A BMI below 18.5 is considered underweight.  A BMI of 18.5 to 24.9 is normal.  A BMI of 25 to 29.9 is considered overweight.  A BMI of 30 and above is considered obese.  Maintain normal blood lipids and cholesterol by exercising and minimizing your intake of saturated fat. Eat a balanced diet with plenty of fruits and vegetables. Blood tests for lipids and cholesterol should begin at age 20 and be repeated every 5 years. If your lipid or cholesterol levels are high, you are over age 50, or you are at high risk for heart disease, you may need your cholesterol levels checked more frequently.Ongoing high lipid and cholesterol levels should be treated with medicines if diet and exercise are not working.  If you smoke, find out from your health care provider how to quit. If you do not use tobacco, do not  start.  Lung cancer screening is recommended for adults aged 55-80 years who are at high risk for developing lung cancer because of a history of smoking. A yearly low-dose CT scan of the lungs is recommended for people who have at least a 30-pack-year history of smoking and are current smokers or have quit within the past 15 years. A pack year of smoking is smoking an average of 1 pack of cigarettes a day for 1 year (for example, a 30-pack-year history of smoking could mean smoking 1 pack a day for 30 years or 2 packs a day for 15 years). Yearly screening should continue until the smoker has stopped smoking for at least 15 years. Yearly screening should be stopped for people who develop a health problem that would prevent them from having lung cancer treatment.  If you choose to drink alcohol, do not have more than 2 drinks per day. One drink is considered to be 12 oz (360 mL) of beer, 5 oz (150 mL) of wine, or 1.5 oz (45 mL) of liquor.  Avoid the use of street drugs. Do not share needles with anyone. Ask for help if you need support or instructions about stopping the use of drugs.  High blood pressure causes heart disease and increases the risk of stroke. Blood pressure should be checked at least every 1-2 years. Ongoing high blood pressure should be treated with medicines if weight loss and exercise are not effective.  If you are 45-79 years old, ask your health care provider if  **Note De-identified Sc[place]r Obfuscation** you should take aspirin to prevent heart disease.  Diabetes screening involves taking a blood sample to check your fasting blood sugar level. This should be done once every 3 years after age 45 if you are at a normal weight and without risk factors for diabetes. Testing should be considered at a younger age or be carried out more frequently if you are overweight and have at least 1 risk factor for diabetes.  Colorectal cancer can be detected and often prevented. Most routine colorectal cancer screening begins at the age of 50  and continues through age 75. However, your health care provider may recommend screening at an earlier age if you have risk factors for colon cancer. On a yearly basis, your health care provider may provide home test kits to check for hidden blood in the stool. A small camera at the end of a tube may be used to directly examine the colon (sigmoidoscopy or colonoscopy) to detect the earliest forms of colorectal cancer. Talk to your health care provider about this at age 50 when routine screening begins. A direct exam of the colon should be repeated every 5-10 years through age 75, unless early forms of precancerous polyps or small growths are found.  People who are at an increased risk for hepatitis B should be screened for this virus. You are considered at high risk for hepatitis B if:  You were born in a country where hepatitis B occurs often. Talk with your health care provider about which countries are considered high risk.  Your parents were born in a high-risk country and you have not received a shot to protect against hepatitis B (hepatitis B vaccine).  You have HIV or AIDS.  You use needles to inject street drugs.  You live with, or have sex with, someone who has hepatitis B.  You are a man who has sex with other men (MSM).  You get hemodialysis treatment.  You take certain medicines for conditions like cancer, organ transplantation, and autoimmune conditions.  Hepatitis C blood testing is recommended for all people born from 1945 through 1965 and any individual with known risk factors for hepatitis C.  Healthy men should no longer receive prostate-specific antigen (PSA) blood tests as part of routine cancer screening. Talk to your health care provider about prostate cancer screening.  Testicular cancer screening is not recommended for adolescents or adult males who have no symptoms. Screening includes self-exam, a health care provider exam, and other screening tests. Consult with your  health care provider about any symptoms you have or any concerns you have about testicular cancer.  Practice safe sex. Use condoms and avoid high-risk sexual practices to reduce the spread of sexually transmitted infections (STIs).  You should be screened for STIs, including gonorrhea and chlamydia if:  You are sexually active and are younger than 24 years.  You are older than 24 years, and your health care provider tells you that you are at risk for this type of infection.  Your sexual activity has changed since you were last screened, and you are at an increased risk for chlamydia or gonorrhea. Ask your health care provider if you are at risk.  If you are at risk of being infected with HIV, it is recommended that you take a prescription medicine daily to prevent HIV infection. This is called pre-exposure prophylaxis (PrEP). You are considered at risk if:  You are a man who has sex with other men (MSM).  You are a heterosexual man who  **Note De-identified Scarlata Obfuscation** is sexually active with multiple partners.  You take drugs by injection.  You are sexually active with a partner who has HIV.  Talk with your health care provider about whether you are at high risk of being infected with HIV. If you choose to begin PrEP, you should first be tested for HIV. You should then be tested every 3 months for as long as you are taking PrEP.  Use sunscreen. Apply sunscreen liberally and repeatedly throughout the day. You should seek shade when your shadow is shorter than you. Protect yourself by wearing long sleeves, pants, a wide-brimmed hat, and sunglasses year round whenever you are outdoors.  Tell your health care provider of new moles or changes in moles, especially if there is a change in shape or color. Also, tell your health care provider if a mole is larger than the size of a pencil eraser.  A one-time screening for abdominal aortic aneurysm (AAA) and surgical repair of large AAAs by ultrasound is recommended for men aged  65-75 years who are current or former smokers.  Stay current with your vaccines (immunizations). Document Released: 03/08/2008 Document Revised: 09/15/2013 Document Reviewed: 02/05/2011 ExitCare Patient Information 2015 [employer]. This information is not intended to replace advice given to you by your health care provider. Make sure you discuss any questions you have with your health care provider.  

## 2015-04-05 NOTE — Progress Notes (Addendum)
**Note De-Identified [place] Obfuscation** Subjective:  This chart was scribed for Lesle ChrisSteven Cydney Alvarenga, MD by Broadus Johnawaa Al Rifaie, Medical Scribe. This patient was seen in Room 22 and the patient's care was started at 8:38 AM.   Patient ID: Jared Molina, male    DOB: October 13, 1960, 54 y.o.   MRN: [id number]  HPI HPI Comments: [name] Kuch is a 54 y.o. male with a PMHx of depression and HLD, who presents to [facility] for a complete physical exam.   Pt states that he quit smoking tobacco in February of last year, however he started doing vapor cigarette later that year, in which has gradually decreased in its nicotine dosage. Today he is on the lowest dosage present. Pt indicates that he feels that he has been feeling better since stopping tobacco. He believes that his body cannot go without some nicotine in his system, and he shows concern for this problem.   Pt notes that he has been having much going on in his personal life. He states that he has just received a new job offer that he will be starting next week. The job is going to be work from [employer]. Pt believes that this job has much more potential than his previous one, and he is very excited to start it. Pt also indicates that his wife was laid off her last job, and is now looking to be in EMT school. He notes that his family has just moved to a new house in [place].    Review of Systems  All other systems reviewed and are negative.      Objective:   Physical Exam  Constitutional: He is oriented to person, place, and time. He appears well-developed and well-nourished. No distress.  HENT:  Head: Normocephalic and atraumatic.  Left Ear: External ear normal.  Mouth/Throat: Oropharynx is clear and moist. No oropharyngeal exudate.  On the right ear evident of previous surgery, there is a scar over the right mastoid.   Eyes: EOM are normal. Pupils are equal, round, and reactive to light.  Neck: Neck supple.  Cardiovascular: Normal rate and regular rhythm.  Exam reveals no  gallop and no friction rub.   No murmur heard. Pulmonary/Chest: Effort normal and breath sounds normal. No respiratory distress. He has no wheezes. He has no rales.  Musculoskeletal: Normal range of motion. He exhibits no edema.  Neurological: He is alert and oriented to person, place, and time. No cranial nerve deficit.  Skin: Skin is warm and dry.  Psychiatric: He has a normal mood and affect. His behavior is normal.  Nursing note and vitals reviewed.  Results for orders placed or performed in visit on 04/05/15  POCT UA - Microscopic Only  Result Value Ref Range   WBC, Ur, HPF, POC Neg    RBC, urine, microscopic 2-3    Bacteria, U Microscopic trace    Mucus, UA Positive    Epithelial cells, urine per micros Negative    Crystals, Ur, HPF, POC Negative    Casts, Ur, LPF, POC Negative    Yeast, UA Negative   POCT urinalysis dipstick  Result Value Ref Range   Color, UA Yellow    Clarity, UA Clear    Glucose, UA Negative    Bilirubin, UA Negative    Ketones, UA 15    Spec Grav, UA 1.015    Blood, UA Negative    pH, UA 7.0    Protein, UA Negative    Urobilinogen, UA 0.2    Nitrite, **Note De-Identified Smead Obfuscation** UA Negative    Leukocytes, UA Negative Negative      Assessment & Plan:   1. Annual physical exam  - CBC with Differential/Platelet - POCT UA - Microscopic Only - POCT urinalysis dipstick - POC Hemoccult Bld/Stl (3-Cd Home Screen); Future - PSA  2. Hyperlipidemia  - Lipid panel - simvastatin (ZOCOR) 40 MG tablet; TAKE 1 TABLET (40 MG TOTAL) BY MOUTH DAILY.  Dispense: 30 tablet; Refill: 11  3. Depression  - TSH - risperiDONE (RISPERDAL) 1 MG tablet; TAKE 1 TABLET (1 MG TOTAL) BY MOUTH DAILY.  Dispense: 30 tablet; Refill: 11 - divalproex (DEPAKOTE) 500 MG DR tablet; TAKE 1 TABLET (500 MG TOTAL) BY MOUTH AT BEDTIME.  Dispense: 30 tablet; Refill: 11  4. Smoker Not currently smoking. He does use nicotine replacements.   5. Thyroid nodule Last US showed resolution of the previously noted  nodule. There are no current nodules that ned to be biopsy.   6. Alcohol consumption of more than four drinks per day on alcohol screening He will continue to work on decreasing his alcohol intake.  - COMPLETE METABOLIC PANEL WITH GFR   I personally performed the services described in this documentation, which was scribed in my presence. The recorded information has been reviewed and is accurate.  Lesle Chris, MD  [facility]  04/05/2015 10:09 AM

## 2015-04-06 LAB — PSA: PSA: 0.71 ng/mL (ref <=4.00)

## 2015-04-26 ENCOUNTER — Telehealth: Payer: Self-pay

## 2015-04-26 NOTE — Telephone Encounter (Signed)
**Note De-Identified [place] Obfuscation** Pt would like to speak with Dr Cleta Alberts about a sleep aid. Please call [phone]

## 2015-04-26 NOTE — Telephone Encounter (Signed)
**Note De-Identified [place] Obfuscation** All of the sleep aids are fairly addictive. There are some other prescription drugs that are related to melatonin but they are quite expensive. She if he has  tried Benadryl 25 mg 1-2 at bedtime. This would be safe and nonaddictive. If this is not l effective we can discuss prescription meds that are not addictive. Sometimes we use an anti-depressant such as Elavil to help with sleep at night. If he wants to give this a try that would be fine please let me know.

## 2015-04-26 NOTE — Telephone Encounter (Signed)
**Note De-Identified Wilber Obfuscation** Spoke with pt, he states he had a physical in July with Dr. Cleta Alberts. He has been taking Melatonin. He feels it has a lot to do with stress. He states it is hard to fall asleep and some nights he falls asleep but then wakes up shortly after. He tried Ambien about 7 years ago but does not ant to take anything addictive.

## 2015-04-28 NOTE — Telephone Encounter (Signed)
**Note De-Identified [place] Obfuscation** Spoke with pt, advised message from Dr. Cleta Alberts. Pt states he has actually been doing better this week with the melatonin. He will call me back if things get worse but wants to hold off on this right now. He may try the Benadryl also to see if this works.

## 2015-07-18 ENCOUNTER — Ambulatory Visit (INDEPENDENT_AMBULATORY_CARE_PROVIDER_SITE_OTHER): Payer: No Typology Code available for payment source | Admitting: Family Medicine

## 2015-07-18 VITALS — BP 110/70 | HR 109 | Temp 98.3°F | Resp 16 | Ht 71.25 in | Wt 199.5 lb

## 2015-07-18 DIAGNOSIS — F329 Major depressive disorder, single episode, unspecified: Secondary | ICD-10-CM

## 2015-07-18 DIAGNOSIS — F32A Depression, unspecified: Secondary | ICD-10-CM

## 2015-07-18 MED ORDER — DIVALPROEX SODIUM 500 MG PO DR TAB: DELAYED_RELEASE_TABLET | ORAL | Status: DC

## 2015-07-18 MED ORDER — RISPERIDONE 1 MG PO TABS: ORAL_TABLET | ORAL | Status: DC

## 2015-07-18 NOTE — Patient Instructions (Signed)
**Note De-identified Farish Obfuscation**  **Note De-Identified [place] Obfuscation** [facility] has counseling that would be helpful. [phone]

## 2015-07-18 NOTE — Progress Notes (Signed)
**Note De-identified Kibble Obfuscation**   **Note De-Identified [place]o Obfuscation** Subjective:    Patient ID: Jared InglesDavid Molina, male    DOB: 08/27/1961, 54 y.o.   MRN: [id number] This chart was scribed for Elvina SidleKurt Dagny Fiorentino, MD by Littie Deedsichard Sun, Medical Scribe. This patient was seen in Room 2 and the patient's care was started at 4:59 PM.   HPI HPI Comments: Jared Molina is a 54 y.o. male with a history of depression and EtOH dependence who presents to the [facility] complaining of increased depression over the past few days. Patient states this was triggered after his mother called 4 days ago with news that his dad was in the hospital. He thinks his medications may need to be adjusted/increased. He has had depression and anxiety problems since the 1980s. He has been on risperidone and Depakote, which he takes at night, for the past 10 years. Patient also started going to AA and is currently about 30 days sober. Since then, he has noticed that he has been sleeping better. His wife has been supportive of quitting alcohol. He is not currently going through any counseling, but he has in the past. Patient lives with his wife and also lives with his nephew. He denies any conflicts at home.  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 He travels often for work.  Review of Systems  Psychiatric/Behavioral: Positive for dysphoric mood.       Objective:   Physical Exam CONSTITUTIONAL: Well developed/well nourished HEAD: Normocephalic/atraumatic EYES: EOM/PERRL ENMT: Mucous membranes moist NECK: supple no meningeal signs SPINE: entire spine nontender GU: no cva tenderness NEURO: Pt is awake/alert, moves all extremitiesx4 EXTREMITIES: pulses normal, full ROM SKIN: warm, color normal PSYCH:         Assessment & Plan:   By signing my name below, I, Littie Deedsichard Sun, attest that this documentation has been prepared under the direction and in the presence of Elvina SidleKurt Cloa Bushong, MD.  Electronically Signed: Littie Deedsichard Sun, Medical Scribe.  07/18/2015. 4:59 PM.   This chart was scribed in my presence and reviewed by me personally.    ICD-9-CM ICD-10-CM   1. Depression 311 F32.9 divalproex (DEPAKOTE) 500 MG DR tablet     risperiDONE (RISPERDAL) 1 MG tablet   Encouraged to stay with AA and start getting counseling as well.  Signed, Elvina SidleKurt Damarko Stitely, MD

## 2015-07-24 ENCOUNTER — Ambulatory Visit (INDEPENDENT_AMBULATORY_CARE_PROVIDER_SITE_OTHER): Payer: No Typology Code available for payment source | Admitting: Urgent Care

## 2015-07-24 VITALS — BP 120/70 | HR 70 | Temp 98.2°F | Resp 18 | Ht 71.0 in | Wt 198.4 lb

## 2015-07-24 DIAGNOSIS — F3170 Bipolar disorder, currently in remission, most recent episode unspecified: Secondary | ICD-10-CM | POA: Diagnosis not present

## 2015-07-24 DIAGNOSIS — F1010 Alcohol abuse, uncomplicated: Secondary | ICD-10-CM

## 2015-07-24 DIAGNOSIS — F419 Anxiety disorder, unspecified: Secondary | ICD-10-CM | POA: Diagnosis not present

## 2015-07-24 LAB — VALPROIC ACID LEVEL: Valproic Acid Lvl: 79.5 ug/mL (ref 50.0–100.0)

## 2015-07-24 MED ORDER — PROPRANOLOL HCL 10 MG PO TABS: 10.0000 mg | ORAL_TABLET | Freq: Three times a day (TID) | ORAL | Status: DC

## 2015-07-24 MED ORDER — HYDROXYZINE HCL 50 MG PO TABS: 50.0000 mg | ORAL_TABLET | Freq: Three times a day (TID) | ORAL | Status: DC | PRN

## 2015-07-24 NOTE — Progress Notes (Signed)
**Note De-Identified [place] Obfuscation** MRN: [id number] DOB: 1961-06-12  Subjective:   Jared Molina Quigg is a 54 y.o. male presenting for chief complaint of Medication Management  Reports 1 week history of feeling anxious, difficulty sleeping, feeling overwhelmed and stressed, intermittent dull headaches. Has not tried any medications for relief. Patient has a complicated psychiatric history. He was diagnosed with Bipolar disorder ~30 years ago, treated with Lithium and Depakote for some time until patient decided to stop taking his medications in the 1990's. He subsequently was hospitalized 2-3 times in the 2000's, started on Depakote and Risperdal which he has maintained. Patient has had alcohol abuse issues until he recently stopped drinking and went to [employer] meetings, he has been sober for ~1 month. Patient still smokes, vaporized nicotine. He recently started seeing a therapist [facility], has a psychiatrist appointment on 08/02/2015. Dr. Cleta Albertsaub has been helping patient manage his bipolar disorder for the past several years. Patient would like to know options for his anxiety and possibly depression. Denies any particular stressors in the past month apart from alcohol cessation. Denies confusion, disorientation, double vision, numbness or tingling, thoughts about death or wanting to hurt others. Denies any other aggravating or relieving factors, no other questions or concerns.  Jared Molina has a current medication list which includes the following prescription(s): divalproex, risperidone, and simvastatin. Also has No Known Allergies.  Jared Molina  has a past medical history of Depression. Also  has no past surgical history on file.  Objective:   Vitals: BP 120/70 mmHg  Pulse 70  Temp(Src) 98.2 F (36.8 C) (Oral)  Resp 18  Ht 5\' 11"  (1.803 m)  Wt 198 lb 6 oz (89.982 kg)  BMI 27.68 kg/m2  SpO2 98%  Physical Exam  Constitutional: He is oriented to person, place, and time. He appears well-developed and well-nourished.  HENT:    Mouth/Throat: Oropharynx is clear and moist.  Eyes: Right eye exhibits no discharge. Left eye exhibits no discharge. No scleral icterus.  Neck: Normal range of motion. Neck supple. No thyromegaly present.  Cardiovascular: Normal rate, regular rhythm and intact distal pulses.  Exam reveals no gallop and no friction rub.   No murmur heard. Pulmonary/Chest: No respiratory distress. He has no wheezes. He has no rales.  Abdominal: Soft. Bowel sounds are normal. He exhibits no distension and no mass. There is no tenderness.  Neurological: He is alert and oriented to person, place, and time. No cranial nerve deficit.  Skin: Skin is warm and dry. No rash noted. No erythema. No pallor.  Psychiatric: His affect is not angry, not blunt and not labile. His speech is not rapid and/or pressured, not delayed and not slurred. He is slowed. He is not agitated, not aggressive and not hyperactive. He expresses no homicidal and no suicidal ideation. He is communicative.  Patient has flat affect and at times overwhelmed with trying to maintain sobriety and manage his psychiatric diagnosis.   Assessment and Plan :   1. Bipolar affective disorder in remission (HCC) 2. Anxiety - Counseled patient on diagnosis, will obtain Depakote level and consider dose increase prior to his psychiatrist appointment. I suggested patient discuss Lamictal to help with Bipolar depression at that visit. Patient and I agree that benzodiazepines for his sleep is not okay given his alcohol abuse history. I offered patient hydroxyzine and propranolol for his anxiety and difficulty sleeping. These scripts were printed and can be used as needed. Patient is to call me if he is having increasing difficulty managing his mood and **Note De-Identified Esperanza Obfuscation** alcohol use.  3. Alcohol abuse - Continue AA meetings, no benzo use for anxiety. Follow up with [facility].  Wallis Bamberg, PA-C [facility] [phone] 07/24/2015 10:48 AM

## 2015-07-24 NOTE — Patient Instructions (Signed)
**Note De-Identified [place] Obfuscation** Bipolar Disorder Bipolar disorder is a mental illness. The term bipolar disorder actually is used to describe a group of disorders that all share varying degrees of emotional highs and lows that can interfere with daily functioning, such as work, school, or relationships. Bipolar disorder also can lead to drug abuse, hospitalization, and suicide. The emotional highs of bipolar disorder are periods of elation or irritability and high energy. These highs can range from a mild form (hypomania) to a severe form (mania). People experiencing episodes of hypomania may appear energetic, excitable, and highly productive. People experiencing mania may behave impulsively or erratically. They often make poor decisions. They may have difficulty sleeping. The most severe episodes of mania can involve having very distorted beliefs or perceptions about the world and seeing or hearing things that are not real (psychotic delusions and hallucinations).  The emotional lows of bipolar disorder (depression) also can range from mild to severe. Severe episodes of bipolar depression can involve psychotic delusions and hallucinations. Sometimes people with bipolar disorder experience a state of mixed mood. Symptoms of hypomania or mania and depression are both present during this mixed-mood episode. SIGNS AND SYMPTOMS There are signs and symptoms of the episodes of hypomania and mania as well as the episodes of depression. The signs and symptoms of hypomania and mania are similar but vary in severity. They include:  Inflated self-esteem or feeling of increased self-confidence.  Decreased need for sleep.  Unusual talkativeness (rapid or pressured speech) or the feeling of a need to keep talking.  Sensation of racing thoughts or constant talking, with quick shifts between topics that may or may not be related (flight of ideas).  Decreased ability to focus or concentrate.  Increased purposeful activity, such as work, studies,  or social activity, or nonproductive activity, such as pacing, squirming and fidgeting, or finger and toe tapping.  Impulsive behavior and use of poor judgment, resulting in high-risk activities, such as having unprotected sex or spending excessive amounts of money. Signs and symptoms of depression include the following:   Feelings of sadness, hopelessness, or helplessness.  Frequent or uncontrollable episodes of crying.  Lack of feeling anything or caring about anything.  Difficulty sleeping or sleeping too much.  Inability to enjoy the things you used to enjoy.   Desire to be alone all the time.   Feelings of guilt or worthlessness.  Lack of energy or motivation.   Difficulty concentrating, remembering, or making decisions.  Change in appetite or weight beyond normal fluctuations.  Thoughts of death or the desire to harm yourself. DIAGNOSIS  Bipolar disorder is diagnosed through an assessment by your caregiver. Your caregiver will ask questions about your emotional episodes. There are two main types of bipolar disorder. People with type I bipolar disorder have manic episodes with or without depressive episodes. People with type II bipolar disorder have hypomanic episodes and major depressive episodes, which are more serious than mild depression. The type of bipolar disorder you have can make an important difference in how your illness is monitored and treated. Your caregiver may ask questions about your medical history and use of alcohol or drugs, including prescription medication. Certain medical conditions and substances also can cause emotional highs and lows that resemble bipolar disorder (secondary bipolar disorder).  TREATMENT  Bipolar disorder is a long-term illness. It is best controlled with continuous treatment rather than treatment only when symptoms occur. The following treatments can be prescribed for bipolar disorders:  Medication--Medication can be prescribed by  a doctor that **Note De-Identified [place] Obfuscation** is an expert in treating mental disorders (psychiatrists). Medications called mood stabilizers are usually prescribed to help control the illness. Other medications are sometimes added if symptoms of mania, depression, or psychotic delusions and hallucinations occur despite the use of a mood stabilizer.  Talk therapy--Some forms of talk therapy are helpful in providing support, education, and guidance. A combination of medication and talk therapy is best for managing the disorder over time. A procedure in which electricity is applied to your brain through your scalp (electroconvulsive therapy) is used in cases of severe mania when medication and talk therapy do not work or work too slowly.   This information is not intended to replace advice given to you by your health care provider. Make sure you discuss any questions you have with your health care provider.   Document Released: 12/17/2000 Document Revised: 10/01/2014 Document Reviewed: 10/06/2012 Elsevier Interactive Patient Education 2016 Elsevier Inc.   Valproic Acid, Divalproex Sodium delayed or extended-release tablets What is this medicine? DIVALPROEX SODIUM (dye VAL pro ex SO dee um) is used to prevent seizures caused by some forms of epilepsy. It is also used to treat bipolar mania and to prevent migraine headaches. This medicine may be used for other purposes; ask your health care provider or pharmacist if you have questions. What should I tell my health care provider before I take this medicine? They need to know if you have any of these conditions: -blood disease -brain damage or disease -kidney disease -liver disease -low blood proteins -mitochondrial disease -suicidal thoughts, plans, or attempt; a previous suicide attempt by you or a family member -urea cycle disorder (UCD) -an unusual or allergic reaction to divalproex sodium, other medicines, foods, dyes, or preservatives -pregnant or trying to get  pregnant -breast-feeding How should I use this medicine? Take this medicine by mouth with a drink of water. Follow the directions on the prescription label. Do not crush or chew. If this medicine upsets your stomach, take it with food or milk. Take your medicine at regular intervals. Do not take it more often than directed. Talk to your pediatrician regarding the use of this medicine in children. Special care may be needed. Overdosage: If you think you have taken too much of this medicine contact a poison control center or emergency room at once. NOTE: This medicine is only for you. Do not share this medicine with others. What if I miss a dose? If you miss a dose, take it as soon as you can. If it is almost time for your next dose, take only that dose. Do not take double or extra doses. What may interact with this medicine? -aspirin -barbiturates, like phenobarbital -diazepam -isoniazid -medicines for depression, anxiety, or psychotic disturbances -medicines that treat or prevent blood clots like warfarin -meropenem -other seizure medicines -rifampin -tolbutamide -zidovudine This list may not describe all possible interactions. Give your health care provider a list of all the medicines, herbs, non-prescription drugs, or dietary supplements you use. Also tell them if you smoke, drink alcohol, or use illegal drugs. Some items may interact with your medicine. What should I watch for while using this medicine? Visit your doctor or health care professional for regular checks on your progress. If you are taking this medicine to treat epilepsy (seizures), do not stop taking it suddenly. This increases the risk of seizures. Wear a medical identification bracelet or chain to say you have epilepsy or seizures, and carry a card that lists all your medicines. You may get drowsy, dizzy, **Note De-Identified [place] Obfuscation** or have blurred vision. Do not drive, use machinery, or do anything that needs mental alertness until you know how this  medicine affects you. To reduce dizzy or fainting spells, do not sit or stand up quickly, especially if you are an older patient. Alcohol can increase drowsiness and dizziness. Avoid alcoholic drinks. This medicine can cause blood problems. This can mean slow healing and a risk of infection. Problems can arise if you need dental work, and in the day to day care of your teeth. Try to avoid damage to your teeth and gums when you brush or floss your teeth. This medicine can make you more sensitive to the sun. Keep out of the sun. If you cannot avoid being in the sun, wear protective clothing and use sunscreen. Do not use sun lamps or tanning beds/booths. The use of this medicine may increase the chance of suicidal thoughts or actions. Pay special attention to how you are responding while on this medicine. Any worsening of mood, or thoughts of suicide or dying should be reported to your health care professional right away. Women who become pregnant while using this medicine may enroll in the [place] American Antiepileptic Drug Pregnancy Registry by calling [phone]. This registry collects information about the safety of antiepileptic drug use during pregnancy. Contact your doctor or healthcare professional if you notice any part of your medicine in your stool. Your healthcare provider may want to check the amount of medicine in your blood if this happens. What side effects may I notice from receiving this medicine? Side effects that you should report to your doctor or health care professional as soon as possible: -allergic reactions like skin rash, itching or hives, swelling of the face, lips, or tongue -changes in the frequency or severity of seizures -double vision or uncontrollable eye movements -nausea and vomiting -redness, blistering, peeling or loosening of the skin, including inside the mouth -stomach pain or cramps -trembling of hands or arms -unusual bleeding or bruising or pinpoint red  spots on the skin -unusual swelling of the arms or legs -unusually weak or tired -worsening of mood, thoughts or actions of suicide or dying -yellowing of skin or eyes Side effects that usually do not require medical attention (report to your doctor or health care professional if they continue or are bothersome): -change in menstrual cycle -diarrhea or constipation -headache -loss of bladder control -loss of hair or unusual growth of hair -loss or increase in appetite -weight gain or loss This list may not describe all possible side effects. Call your doctor for medical advice about side effects. You may report side effects to FDA at 1-800-FDA-1088. Where should I keep my medicine? Keep out of reach of children. Store at room temperature between 15 and 30 degrees C (59 and 86 degrees F). Keep container tightly closed. Throw away any unused medicine after the expiration date. NOTE: This sheet is a summary. It may not cover all possible information. If you have questions about this medicine, talk to your doctor, pharmacist, or health care provider.    2016, Elsevier/Gold Standard. (2012-05-06 11:50:42)

## 2015-07-26 ENCOUNTER — Encounter: Payer: Self-pay | Admitting: Urgent Care

## 2015-07-29 ENCOUNTER — Telehealth: Payer: Self-pay

## 2015-07-29 NOTE — Telephone Encounter (Signed)
**Note De-Identified [place] Obfuscation** Labs reviewed and patient letter sent. Records faxed to [facility] for upcoming appt.

## 2015-07-29 NOTE — Telephone Encounter (Signed)
**Note De-Identified Jared Molina Obfuscation** Patient is calling to get lab results. [facility] also [facility]  and needs his physical from year and recent blood work sent to their office.  Fax: [phone] Attn: Dr. Beverly MilchGlenn Jennings Patient phone: [phone]

## 2015-07-29 NOTE — Telephone Encounter (Signed)
**Note De-identified [place] Obfuscation** Please advise on labs.

## 2015-08-01 NOTE — Telephone Encounter (Signed)
**Note De-Identified [place] Obfuscation** Discussed results with patient, Depakote level is within therapeutic range. He has evaluation with [facility]. Suggested he discuss possibility of adding Lamictal. He is to let me know if [facility] wants to work with us further for management of his mental health.

## 2015-08-13 ENCOUNTER — Encounter (HOSPITAL_COMMUNITY): Payer: Self-pay | Admitting: Emergency Medicine

## 2015-08-13 ENCOUNTER — Inpatient Hospital Stay (HOSPITAL_COMMUNITY)
Admission: AD | Admit: 2015-08-13 | Discharge: 2015-08-18 | DRG: 885 | Disposition: A | Discharge status: Home / Self Care | Payer: No Typology Code available for payment source | Source: Intra-hospital | Attending: Psychiatry | Admitting: Psychiatry

## 2015-08-13 ENCOUNTER — Emergency Department (HOSPITAL_COMMUNITY)
Admission: EM | Admit: 2015-08-13 | Discharge: 2015-08-13 | Disposition: A | Discharge status: Other Acute Inpatient Hospital | Payer: No Typology Code available for payment source | Attending: Emergency Medicine | Admitting: Emergency Medicine

## 2015-08-13 DIAGNOSIS — E785 Hyperlipidemia, unspecified: Secondary | ICD-10-CM

## 2015-08-13 DIAGNOSIS — F319 Bipolar disorder, unspecified: Secondary | ICD-10-CM | POA: Diagnosis present

## 2015-08-13 DIAGNOSIS — F419 Anxiety disorder, unspecified: Secondary | ICD-10-CM | POA: Diagnosis not present

## 2015-08-13 DIAGNOSIS — F3163 Bipolar disorder, current episode mixed, severe, without psychotic features: Secondary | ICD-10-CM | POA: Diagnosis present

## 2015-08-13 DIAGNOSIS — F332 Major depressive disorder, recurrent severe without psychotic features: Secondary | ICD-10-CM | POA: Diagnosis present

## 2015-08-13 DIAGNOSIS — F3176 Bipolar disorder, in full remission, most recent episode depressed: Secondary | ICD-10-CM | POA: Diagnosis present

## 2015-08-13 DIAGNOSIS — F3160 Bipolar disorder, current episode mixed, unspecified: Secondary | ICD-10-CM | POA: Diagnosis not present

## 2015-08-13 DIAGNOSIS — G4700 Insomnia, unspecified: Secondary | ICD-10-CM | POA: Diagnosis not present

## 2015-08-13 DIAGNOSIS — F1020 Alcohol dependence, uncomplicated: Secondary | ICD-10-CM | POA: Diagnosis present

## 2015-08-13 DIAGNOSIS — Z79899 Other long term (current) drug therapy: Secondary | ICD-10-CM | POA: Insufficient documentation

## 2015-08-13 DIAGNOSIS — Z87891 Personal history of nicotine dependence: Secondary | ICD-10-CM

## 2015-08-13 DIAGNOSIS — F315 Bipolar disorder, current episode depressed, severe, with psychotic features: Secondary | ICD-10-CM | POA: Diagnosis not present

## 2015-08-13 DIAGNOSIS — R51 Headache: Secondary | ICD-10-CM | POA: Diagnosis not present

## 2015-08-13 DIAGNOSIS — F39 Unspecified mood [affective] disorder: Secondary | ICD-10-CM | POA: Diagnosis present

## 2015-08-13 DIAGNOSIS — F1021 Alcohol dependence, in remission: Secondary | ICD-10-CM | POA: Diagnosis present

## 2015-08-13 HISTORY — DX: Bipolar disorder, unspecified: F31.9

## 2015-08-13 LAB — CBC WITH DIFFERENTIAL/PLATELET
BASOS ABS: 0 10*3/uL (ref 0.0–0.1)
BASOS PCT: 0 %
EOS ABS: 0 10*3/uL (ref 0.0–0.7)
EOS PCT: 0 %
HEMATOCRIT: 45.1 % (ref 39.0–52.0)
Hemoglobin: 15.2 g/dL (ref 13.0–17.0)
Lymphocytes Relative: 15 %
Lymphs Abs: 0.9 10*3/uL (ref 0.7–4.0)
MCH: 30.4 pg (ref 26.0–34.0)
MCHC: 33.7 g/dL (ref 30.0–36.0)
MCV: 90.2 fL (ref 78.0–100.0)
MONO ABS: 0.8 10*3/uL (ref 0.1–1.0)
MONOS PCT: 14 %
NEUTROS ABS: 4.1 10*3/uL (ref 1.7–7.7)
Neutrophils Relative %: 71 %
Platelets: 289 10*3/uL (ref 150–400)
RBC: 5 MIL/uL (ref 4.22–5.81)
RDW: 12.6 % (ref 11.5–15.5)
WBC: 5.9 10*3/uL (ref 4.0–10.5)

## 2015-08-13 LAB — BASIC METABOLIC PANEL
Anion gap: 8 (ref 5–15)
BUN: 7 mg/dL (ref 6–20)
CHLORIDE: 101 mmol/L (ref 101–111)
CO2: 25 mmol/L (ref 22–32)
CREATININE: 0.88 mg/dL (ref 0.61–1.24)
Calcium: 9.3 mg/dL (ref 8.9–10.3)
GFR calc non Af Amer: >60 mL/min (ref >60)
GLUCOSE: 101 mg/dL — AB (ref 65–99)
Potassium: 4.2 mmol/L (ref 3.5–5.1)
Sodium: 134 mmol/L — ABNORMAL LOW (ref 135–145)

## 2015-08-13 LAB — VALPROIC ACID LEVEL: Valproic Acid Lvl: 65 ug/mL (ref 50.0–100.0)

## 2015-08-13 LAB — RAPID URINE DRUG SCREEN, HOSP PERFORMED
AMPHETAMINES: NOT DETECTED
Barbiturates: NOT DETECTED
Benzodiazepines: NOT DETECTED
COCAINE: NOT DETECTED
Opiates: NOT DETECTED
TETRAHYDROCANNABINOL: NOT DETECTED

## 2015-08-13 LAB — ETHANOL: Alcohol, Ethyl (B): <5 mg/dL (ref <5)

## 2015-08-13 LAB — TSH: TSH: 0.892 u[IU]/mL (ref 0.350–4.500)

## 2015-08-13 MED ORDER — DIVALPROEX SODIUM ER 500 MG PO TB24
1500.0000 mg | ORAL_TABLET | Freq: Every day | ORAL | Status: DC
  Administered 2015-08-13 – 2015-08-17 (×4): 1500 mg via ORAL
  Filled 2015-08-13 (×9): qty 3

## 2015-08-13 MED ORDER — ACETAMINOPHEN 325 MG PO TABS
650.0000 mg | ORAL_TABLET | Freq: Four times a day (QID) | ORAL | Status: DC | PRN
  Administered 2015-08-15 (×2): 650 mg via ORAL
  Filled 2015-08-13 (×2): qty 2

## 2015-08-13 MED ORDER — LORAZEPAM 1 MG PO TABS
1.0000 mg | ORAL_TABLET | Freq: Once | ORAL | Status: AC
  Administered 2015-08-13: 1 mg via ORAL
  Filled 2015-08-13: qty 1

## 2015-08-13 MED ORDER — HYDROXYZINE HCL 50 MG PO TABS
50.0000 mg | ORAL_TABLET | Freq: Four times a day (QID) | ORAL | Status: DC | PRN
  Administered 2015-08-13 – 2015-08-18 (×2): 50 mg via ORAL
  Filled 2015-08-13 (×2): qty 1

## 2015-08-13 MED ORDER — SIMVASTATIN 40 MG PO TABS
40.0000 mg | ORAL_TABLET | Freq: Every day | ORAL | Status: DC
  Administered 2015-08-13 – 2015-08-17 (×5): 40 mg via ORAL
  Filled 2015-08-13 (×2): qty 1
  Filled 2015-08-13: qty 2
  Filled 2015-08-13 (×2): qty 1
  Filled 2015-08-13: qty 2
  Filled 2015-08-13 (×3): qty 1

## 2015-08-13 MED ORDER — OLANZAPINE 5 MG PO TBDP
5.0000 mg | ORAL_TABLET | Freq: Three times a day (TID) | ORAL | Status: DC | PRN
  Administered 2015-08-14 (×2): 5 mg via ORAL
  Filled 2015-08-13 (×3): qty 1

## 2015-08-13 MED ORDER — TRAZODONE HCL 100 MG PO TABS
100.0000 mg | ORAL_TABLET | Freq: Every evening | ORAL | Status: DC | PRN
  Administered 2015-08-13: 100 mg via ORAL
  Filled 2015-08-13: qty 1

## 2015-08-13 MED ORDER — MAGNESIUM HYDROXIDE 400 MG/5ML PO SUSP: 30.0000 mL | Freq: Every day | ORAL | Status: DC | PRN

## 2015-08-13 MED ORDER — ALUM & MAG HYDROXIDE-SIMETH 200-200-20 MG/5ML PO SUSP: 30.0000 mL | ORAL | Status: DC | PRN

## 2015-08-13 MED ORDER — LORAZEPAM 1 MG PO TABS: 1.0000 mg | ORAL_TABLET | Freq: Three times a day (TID) | ORAL | Status: DC | PRN

## 2015-08-13 NOTE — BH Assessment (Signed)
**Note De-Identified [place] Obfuscation** Clinician contacted Asher MuirJamie, PA to gather additional info about patient. Patient requesting inpatient services. UDS and alcohol are negative. TTS to assess.    Nira Retortelilah Zoe Creasman, MSW, LCSW Clinical Social Worker

## 2015-08-13 NOTE — BH Assessment (Addendum)
**Note De-Identified [place] Obfuscation** Tele Assessment Note   Jared Molina is an 54 y.o. male who presents to [facility] accompanied by his wife Jared Molina due to patient endorsing racing thoughts, insomnia, depression and inability to function at his baseline. Pt has been diagnosed with bipolar and depression also alcohol dependence. Pt has had past inpatient hx and most recent was 2006 in facility in [place] due to similar behaviors . Patient stopped drinking in September after drinking daily for several years and he feels he is having a hard time functioning. Patient contacted psychiatrist at [facility]  On 11/18 and reports being directed to discontinue Risperdal. Patient stated "I need help." Patient's wife stated, "I've never seen him like this." Wife reports patient stated this morning he was asking where things were in the house that he normally knows such as the cereal and his phone. Patient reported memory lapses started today.   Patient Ox4. Patient denies SI, HI and AVH. Patient reports anxiety and depression with congruent affect. Patient presents flat affect; makes little eye contact when speaking, and appears to lose track of what he is trying to say quickly. Patient may be experiencing brief psychotic episode that is effecting his inability to function and complete activities of daily living. Patient also reports inability to concentrate and focus at work over the last week.    Diagnosis: Bipolar I, mixed   Past Medical History:  Past Medical History  Diagnosis Date  . Depression   . Bipolar 1 disorder (HCC)     History reviewed. No pertinent past surgical history.  Family History:  Family History  Problem Relation Age of Onset  . Heart disease Father   . Hyperlipidemia Father   . Heart disease Maternal Grandfather   . Hyperlipidemia Maternal Grandfather   . Cancer Paternal Grandmother     lung cancer    Social History:  reports that he quit smoking about 20 months ago. He does not have any smokeless tobacco history on  file. He reports that he does not drink alcohol or use illicit drugs.  Additional Social History:     CIWA: CIWA-Ar BP: 140/77 mmHg Pulse Rate: 88 COWS:    PATIENT STRENGTHS: (choose at least two) Motivation for treatment/growth Supportive family/friends Work skills  Allergies: No Known Allergies  Home Medications:  (Not in a hospital admission)  OB/GYN Status:  No LMP for male patient.  General Assessment Data Location of Assessment: [facility] ED TTS Assessment: In system Is this a Tele or Face-to-Face Assessment?: Tele Assessment Is this an Initial Assessment or a Re-assessment for this encounter?: Initial Assessment Marital status: Married Tatamy name: NA Is patient pregnant?: Other (Comment) (NA) Pregnancy Status: Other (Comment) Living Arrangements: Spouse/significant other Can pt return to current living arrangement?: Yes Admission Status: Voluntary Is patient capable of signing voluntary admission?: Yes Referral Source: Self/Family/Friend Insurance type: generic commercial     Crisis Care Plan Living Arrangements: Spouse/significant other Name of Psychiatrist: [facility] Name of Therapist: [facility]     Risk to self with the past 6 months Suicidal Ideation: No-Not Currently/Within Last 6 Months Has patient been a risk to self within the past 6 months prior to admission? : No Suicidal Intent: No-Not Currently/Within Last 6 Months Has patient had any suicidal intent within the past 6 months prior to admission? : No Is patient at risk for suicide?: No Suicidal Plan?: No Has patient had any suicidal plan within the past 6 months prior to admission? : No Access to Means: No What has been your use of **Note De-Identified [place] Obfuscation** drugs/alcohol within the last 12 months?: alcohol Previous Attempts/Gestures: No How many times?: 0 Other Self Harm Risks: none reported Triggers for Past Attempts: None known Intentional Self Injurious Behavior: None Family Suicide History: Unknown Recent  stressful life event(s): Other (Comment) (recently ending substance abuse) Persecutory voices/beliefs?: No Depression: Yes Depression Symptoms: Despondent, Insomnia, Fatigue, Feeling angry/irritable Substance abuse history and/or treatment for substance abuse?: Yes Suicide prevention information given to non-admitted patients: Not applicable  Risk to Others within the past 6 months Homicidal Ideation: No Does patient have any lifetime risk of violence toward others beyond the six months prior to admission? : Unknown Thoughts of Harm to Others: No Current Homicidal Intent: No Current Homicidal Plan: No Access to Homicidal Means: No Identified Victim: NA History of harm to others?: No Assessment of Violence: None Noted Violent Behavior Description: Patient calm and cooperative during assessment.  Does patient have access to weapons?: No Criminal Charges Pending?: No Does patient have a court date: No Is patient on probation?: No  Psychosis Hallucinations: None noted Delusions: None noted  Mental Status Report Appearance/Hygiene: In scrubs Eye Contact: Poor Motor Activity: Restlessness Speech: Logical/coherent Level of Consciousness: Irritable, Drowsy Mood: Depressed, Anxious Affect: Irritable, Anxious, Depressed Anxiety Level: Moderate Thought Processes: Coherent, Relevant Judgement: Unimpaired Orientation: Person, Place, Time, Situation Obsessive Compulsive Thoughts/Behaviors: Minimal  Cognitive Functioning Concentration: Decreased Memory: Recent Impaired, Remote Intact IQ: Average Insight: Fair Impulse Control: Fair Appetite: Fair Weight Loss: 0 Weight Gain: 0 Sleep: Decreased Total Hours of Sleep: 3 Vegetative Symptoms: Staying in bed  ADLScreening [facility]) Patient's cognitive ability adequate to safely complete daily activities?: Yes Patient able to express need for assistance with ADLs?: Yes Independently performs ADLs?: Yes (appropriate  for developmental age)  Prior Inpatient Therapy Prior Inpatient Therapy: Yes Prior Therapy Dates: 2006 Prior Therapy Facilty/Provider(s): [place] in [place] Reason for Treatment: similar issues, mania, anxiety  Prior Outpatient Therapy Prior Outpatient Therapy: Yes Prior Therapy Dates: current Prior Therapy Facilty/Provider(s): [facility] Reason for Treatment: Bipolar Does patient have an ACCT team?: No Does patient have Intensive In-House Services?  : No Does patient have [facility] services? : No Does patient have P4CC services?: No  ADL Screening (condition at time of admission) Patient's cognitive ability adequate to safely complete daily activities?: Yes Patient able to express need for assistance with ADLs?: Yes Independently performs ADLs?: Yes (appropriate for developmental age)             Advance Directives (For Healthcare) Does patient have an advance directive?: No Would patient like information on creating an advanced directive?: No - patient declined information    Additional Information 1:1 In Past 12 Months?: No CIRT Risk: No Elopement Risk: No Does patient have medical clearance?: Yes     Disposition: Clinician consulted with Vernona RiegerLaura, NP who states Pt meets criteria for inpatient admission. [facility] Hein[facility]ina [facility].  Disposition Initial Assessment Completed for this Encounter: Yes Disposition of Patient: Inpatient treatment program Type of inpatient treatment program: Adult  Hessie DibbleROBERTS, Breiana Stratmann R 08/13/2015 1:58 PM

## 2015-08-13 NOTE — ED Notes (Signed)
**Note De-Identified [place] Obfuscation** Pt placed in maroon scrubs. [name] belongings sent home with pt's wife

## 2015-08-13 NOTE — ED Notes (Signed)
**Note De-identified [place] Obfuscation** Pt has been wanded by security. 

## 2015-08-13 NOTE — Progress Notes (Signed)
**Note De-Identified [place] Obfuscation** Admission note:  Patient is a 54 yo male who presented to [facility] with his wife.  Patient reported racing thoughts, insomnia, depression and inability to function.  [facility].  Patient states he was diagnosed with Bipolar 1 in his late 11020s.  Patient was taking depakote and lithium until he stopped taking them in the 1990s.  Patient was also abusing alcohol and recently stopped in September.  He has been attending AA for support.  Patient states, "since I stopped drinking, I'm getting worse.  He denies any drug use.  His UDS was negative.  He has been using E-cigarettes in place of nicotine.  Patient denies any stressors except for the cessation of alcohol.  Patient presents with flat, blunted affect and irritable mood.  He is exhibiting some thought blocking.  He wants help with his depression and anxiety.  He denies any SI/HI/AVH.  Patient has no pertinent medication hx except hyperlipidemia.  Patient was oriented to room and unit.  He has remains isolative and has been pacing the hallway.

## 2015-08-13 NOTE — BH Assessment (Signed)
**Note De-Identified [place] Obfuscation** Clinician consulted with Vernona RiegerLaura, NP who states Pt meets criteria for inpatient admission. [facility]. Pt assigned to bed 503-2 to Dr. Elna BreslowEappen.   Nira Retortelilah Reyaansh Merlo, MSW, LCSW Triage Specialist [phone],

## 2015-08-13 NOTE — ED Notes (Signed)
**Note De-Identified [place] Obfuscation** Pt has been diagnosed with bipolar and depression also alcohol dependence. Pt has had hospital admissions to psychiatric hospitals in the past. Most recent was 2006. Pt stopped drinking in September and feels he is having a hard time functioning. [name] Psych MD has changed meds recently. Pt has racing thoughts, insomnia, depression, unable to function at baseline. Pt feels trapped in his head at times and unable to get out his thoughts. [name] wife at bedside, very supportive. Pt denies any SI or HI.

## 2015-08-13 NOTE — ED Provider Notes (Signed)
**Note De-Identified Befort Obfuscation** CSN: [id number]     Arrival date & time 08/13/15  [id number] History   First MD Initiated Contact with Patient 08/13/15 1004     Chief Complaint  Patient presents with  . Psychiatric Evaluation     (Consider location/radiation/quality/duration/timing/severity/associated sxs/prior Treatment) The history is provided by the patient and medical records. No language interpreter was used.   Jared Molina is a 54 y.o. male  with a PMH of Bipolar disorder, anxiety, and depression presents to the Emergency Department complaining of mood swings and worsening insomnia over the past month. Patient states that he quit drinking in September, and was doing well. Approx. 1 month later, he started having mood swings and difficulty sleeping, Wife stating that if he got 3-4 hours of sleep, that was a good night of rest. Concentration and focus have decreased, and he believes that it is time to get help. He was told yesterday over the phone to discontinue his risperidone and start new medication (unsure name), and increase depakote.   Pt. Denies auditory and visual hallucinations, denies suicidal and homicidal ideations.    Past Medical History  Diagnosis Date  . Depression   . Bipolar 1 disorder (HCC)    History reviewed. No pertinent past surgical history. Family History  Problem Relation Age of Onset  . Heart disease Father   . Hyperlipidemia Father   . Heart disease Maternal Grandfather   . Hyperlipidemia Maternal Grandfather   . Cancer Paternal Grandmother     lung cancer   Social History  Substance Use Topics  . Smoking status: Former Smoker    Quit date: 11/24/2013  . Smokeless tobacco: None  . Alcohol Use: No     Comment: quit drinking in Sept 2016    Review of Systems  Constitutional: Negative.   HENT: Negative for congestion, rhinorrhea and sore throat.   Eyes: Negative for visual disturbance.  Respiratory: Negative for cough, shortness of breath and wheezing.   Cardiovascular: Negative.    Gastrointestinal: Negative for nausea, vomiting, abdominal pain, diarrhea and constipation.  Musculoskeletal: Negative for myalgias, back pain, arthralgias and neck pain.  Skin: Negative for rash.  Neurological: Positive for headaches. Negative for dizziness.  Psychiatric/Behavioral: Positive for sleep disturbance (Insomnia) and decreased concentration. Negative for suicidal ideas, hallucinations and self-injury. The patient is nervous/anxious.       Allergies  Review of patient's allergies indicates no known allergies.  Home Medications   Prior to Admission medications   Medication Sig Start Date End Date Taking? Authorizing Provider  divalproex (DEPAKOTE ER) 500 MG 24 hr tablet Take 1,500 mg by mouth at bedtime.  08/02/15  Yes Historical Provider, MD  hydrOXYzine (ATARAX/VISTARIL) 50 MG tablet Take 1 tablet (50 mg total) by mouth 3 (three) times daily as needed. 07/24/15  Yes Wallis BambergMario Mani, PA-C  simvastatin (ZOCOR) 40 MG tablet TAKE 1 TABLET (40 MG TOTAL) BY MOUTH DAILY. 04/05/15  Yes Collene GobbleSteven A Daub, MD   BP 138/73 mmHg  Pulse 81  Temp(Src) 97.9 F (36.6 C) (Oral)  Resp 18  Ht 6' (1.829 m)  Wt 195 lb (88.451 kg)  BMI 26.44 kg/m2  SpO2 96% Physical Exam  Constitutional: He is oriented to person, place, and time. He appears well-developed and well-nourished.  Alert and in no acute distress  HENT:  Head: Normocephalic and atraumatic.  Cardiovascular: Normal rate, regular rhythm, normal heart sounds and intact distal pulses.  Exam reveals no gallop and no friction rub.   No murmur heard. Pulmonary/Chest: Effort normal and **Note De-Identified Forinash Obfuscation** breath sounds normal. No respiratory distress. He has no wheezes. He has no rales. He exhibits no tenderness.  Abdominal: He exhibits no mass. There is no rebound and no guarding.  Abdomen soft, non-tender, non-distended Bowel sounds positive in all four quadrants  Musculoskeletal: He exhibits no edema.  Neurological: He is alert and oriented to person, place,  and time.  Skin: Skin is warm and dry. No rash noted.  Psychiatric: His behavior is normal. Judgment normal.  Patient's judgment normal.  Slightly flattened affect; makes little eye contact when speaking. + Racing thoughts - appears to lose track of what he is trying to say quickly.   Nursing note and vitals reviewed.   ED Course  Procedures (including critical care time) Labs Review Labs Reviewed  BASIC METABOLIC PANEL - Abnormal; Notable for the following:    Sodium 134 (*)    Glucose, Bld 101 (*)    All other components within normal limits  URINE RAPID DRUG SCREEN, HOSP PERFORMED  CBC WITH DIFFERENTIAL/PLATELET  ETHANOL    Imaging Review No results found. I have personally reviewed and evaluated these images and lab results as part of my medical decision-making.   EKG Interpretation None      MDM   Final diagnoses:  None   Jared Molina presents with worsening of his bipolar symptoms x 1 month. Patient states he believes it is time to get help and wife at bedside appears to be very understanding and supportive of this decision.   Labs: UDS and etoh -; CBC wdl; BMP with na 134, glucose 101 Will consult TTS and place in pod C   Addendum: Bed was available at [facility] before patient was placed in Pod C. Transferred to [facility] from [employer], PA-C 08/13/15 1610  Vanetta Mulders, MD 08/14/15 1000

## 2015-08-13 NOTE — ED Notes (Signed)
**Note De-Identified Gancarz Obfuscation** [facility] and [facility].

## 2015-08-14 ENCOUNTER — Encounter (HOSPITAL_COMMUNITY): Payer: Self-pay | Admitting: Psychiatry

## 2015-08-14 DIAGNOSIS — F315 Bipolar disorder, current episode depressed, severe, with psychotic features: Secondary | ICD-10-CM

## 2015-08-14 MED ORDER — OLANZAPINE 10 MG IM SOLR: 5.0000 mg | Freq: Three times a day (TID) | INTRAMUSCULAR | Status: DC | PRN

## 2015-08-14 NOTE — Tx Team (Signed)
**Note De-Identified Burkman Obfuscation** Initial Interdisciplinary Treatment Plan   PATIENT STRESSORS: Health problems Substance abuse   PATIENT STRENGTHS: Motivation for treatment/growth Supportive family/friends   PROBLEM LIST: Problem List/Patient Goals Date to be addressed Date deferred Reason deferred Estimated date of resolution  "I am sleepy and I want my meds so I can go to sleep." 08/13/15    D/C  ETOH dependence      Depression      Anxiety      Increased risk for SI                               DISCHARGE CRITERIA:  Ability to meet basic life and health needs Improved stabilization in mood, thinking, and/or behavior Motivation to continue treatment in a less acute level of care Need for constant or close observation no longer present Verbal commitment to aftercare and medication compliance  PRELIMINARY DISCHARGE PLAN: Attend 12-step recovery group Outpatient therapy Return to previous living arrangement  PATIENT/FAMIILY INVOLVEMENT: This treatment plan has been presented to and reviewed with the patient, Jared HuaDavid Molina.  The patient and family have been given the opportunity to ask questions and make suggestions.  Jared Molina 08/14/2015, 4:36 AM

## 2015-08-14 NOTE — Progress Notes (Signed)
**Note De-Identified Donner Obfuscation** Pt transferred to 502-2 from 400 due to increase in aggression. Pt was restless and crawling  and kneeling on the floor and demanding to see his wife. Pt wife came,both the wife and txt team  met with pt. Pt took his zyprexa 5 mg PO. Pt later went to sleep. Will continue to monitor.

## 2015-08-14 NOTE — Progress Notes (Signed)
**Note De-Identified Whang Obfuscation** Pt easily agitated by staff and others. Pt rude towards staff and continues to tell staff to leave him alone and get out of the room. Pt hyper-religous and appears to be responding to internal stimuli. Pt repeatedly states "because God is Lord" and kneels down to the ground with his hands to his face as if he's praying. Pt paranoid and refusing to take meds. Pt stated that staff is not trying to help him and that he doesn't trust anyone. Pt transferred to 500 hall per MD request d/t inappropriate behaviors. Report given to Erskine SquibbJane, Charity fundraiserN.

## 2015-08-14 NOTE — [facility] Counselor (Signed)
**Note De-Identified Edmiston Obfuscation** CSW attempted to complete PSA but pt was sleeping and was unable to be awaken.  CSW will attempt later.    Jared IvanChelsea Horton, LCSW 08/14/2015  9:40 AM

## 2015-08-14 NOTE — [facility] Counselor (Signed)
**Note De-Identified Dorris Obfuscation** CSW not attempting PSA this afternoon due to pt being moved from 500 hall to 400 hall, observed sitting on the floor and [facility] to agitation.  Week day CSW to attempt.    Reyes IvanChelsea Horton, LCSW 08/14/2015  2:45 PM

## 2015-08-14 NOTE — Progress Notes (Signed)
**Note De-Identified Frick Obfuscation** D: Patient observed walking in hallway.  When approached very irritable and upset. "I am very tired and I want my meds so I can go to sleep.  Denies SI/HI/AVH. A: Meds given as ordered.   Patient will contract for safety. R:Will continue to monitor with ever 15 safety rounds.

## 2015-08-14 NOTE — Progress Notes (Signed)
**Note De-Identified Firmin Obfuscation** Patient up in hallway states "I am a sick man of god in a mental hospital and I need help." "I can't feel anything."Patient hesitant as if thought blocking.  Patient briefly cried rubbing his face.Next second his face was blank and he was asking to speak to his wife.  Refused to take offered prn's.

## 2015-08-14 NOTE — [facility] Suicide Risk Assessment (Signed)
**Note De-Identified Breault Obfuscation** [facility]   Nursing information obtained from:   patient provided little information, did consent for staff to speak with wife, who provided much information/ chart  Demographic factors:   54 year old married male, employed  Current Mental Status:   see below  Loss Factors:   none reported  Historical Factors:   history of bipolar disorder, history of one prior admission back in 2006  Risk Reduction Factors:   resilience, social support, employed  Total Time spent with patient: 45 minutes Principal Problem:  Bipolar Disorder, Mixed-  Diagnosis:   Patient Active Problem List   Diagnosis Date Noted  . MDD (major depressive disorder), recurrent episode, severe (HCC) [F33.2] 08/13/2015  . Bipolar affective disorder, depressed, severe, with psychotic behavior (HCC) [F31.5] 08/13/2015  . Smoker [Z72.0] 08/26/2012  . Thyroid nodule [E04.1] 08/26/2012  . Hyperlipidemia [E78.5] 08/26/2012  . EtOH dependence (HCC) [F10.20] 08/26/2012  . Depression [F32.9]      Continued Clinical Symptoms:  Alcohol Use Disorder Identification Test Final Score (AUDIT): 4 The "Alcohol Use Disorders Identification Test", Guidelines for Use in Primary Care, Second Edition.  World Science writerHealth Organization [facility](WHO). Score between 0-7:  no or low risk or alcohol related problems. Score between 8-15:  moderate risk of alcohol related problems. Score between 16-19:  high risk of alcohol related problems. Score 20 or above:  warrants further diagnostic evaluation for alcohol dependence and treatment.   CLINICAL FACTORS:  54 year old male , history of bipolar disorder, today presents guarded, angry , hostile, and minimally cooperative with staff. Presents paranoid, guarded. As per collateral information, has been experiencing worsening racing thoughts, anxiety, poor sleep, inability to function  At prior level of functioning. Has history of alcohol abuse but stopped drinking several weeks ago. No  known drug abuse . Psychiatric medications recently changed by outpatient psychiatrist- Risperidone D/Cd and switched to Seroquel.     Psychiatric Specialty Exam: Physical Exam  ROS  Blood pressure 140/94, pulse 92, temperature 98.7 F (37.1 C), temperature source Oral, resp. rate 18, height 5\' 11"  (1.803 m), weight 195 lb (88.451 kg), SpO2 99 %.Body mass index is 27.21 kg/(m^2).   see admit note MSE   COGNITIVE FEATURES THAT CONTRIBUTE TO RISK:  Closed-mindedness and Loss of executive function    SUICIDE RISK:   Moderate:  Frequent suicidal ideation with limited intensity, and duration, some specificity in terms of plans, no associated intent, good self-control, limited dysphoria/symptomatology, some risk factors present, and identifiable protective factors, including available and accessible social support.  PLAN OF CARE: Patient will be admitted to inpatient psychiatric unit for stabilization and safety. Will provide and encourage milieu participation. Provide medication management and maked adjustments as needed.  Will follow daily.    Medical Decision Making:  Review of Psycho-Social Stressors (1), Review or order clinical lab tests (1), Established Problem, Worsening (2) and Review of Medication Regimen & Side Effects (2)  I certify that inpatient services furnished can reasonably be expected to improve the patient's condition.   COBOS, FERNANDO 08/14/2015, 2:40 PM

## 2015-08-14 NOTE — Progress Notes (Signed)
**Note De-Identified Magadan Obfuscation** Psychoeducational Group Note  Date:  08/14/2015 Time:  2130  Group Topic/Focus:  Wrap-Up Group:   The focus of this group is to help patients review their daily goal of treatment and discuss progress on daily workbooks.  Participation Level: Did Not Attend  Participation Quality:  Not Applicable  Affect:  Not Applicable  Cognitive:  Not Applicable  Insight:  Not Applicable  Engagement in Group: Not Applicable  Additional Comments:  The patient did not attend group this evening.   Libia Fazzini S 08/14/2015, 9:30 PM

## 2015-08-14 NOTE — Progress Notes (Signed)
**Note De-Identified Linhardt Obfuscation** Adult Psychoeducational Group Note  Date:  08/14/2015 Time:  1045  Group Topic/Focus:  Making Healthy Choices:   The focus of this group is to help patients identify negative/unhealthy choices they were using prior to admission and identify positive/healthier coping strategies to replace them upon discharge.  Participation Level:  Minimal  Participation Quality:  Inattentive  Affect:  Flat  Cognitive:  Disorganized  Insight: Lacking  Engagement in Group:  Distracting  Modes of Intervention:  Discussion and Education  Additional Comments:    Yalissa Fink L 08/14/2015, 1:30 PM

## 2015-08-14 NOTE — H&P (Signed)
**Note De-Identified [place]man Obfuscation** Psychiatric Admission Assessment Adult  Patient Identification: Jared Molina MRN:  [id number] Date of Evaluation:  08/14/2015 Chief Complaint:  Bipolar Principal Diagnosis: <principal problem not specified> Diagnosis:   Patient Active Problem List   Diagnosis Date Noted  . MDD (major depressive disorder), recurrent episode, severe (HCC) [F33.2] 08/13/2015  . Bipolar affective disorder, depressed, severe, with psychotic behavior (HCC) [F31.5] 08/13/2015  . Smoker [Z72.0] 08/26/2012  . Thyroid nodule [E04.1] 08/26/2012  . Hyperlipidemia [E78.5] 08/26/2012  . EtOH dependence (HCC) [F10.20] 08/26/2012  . Depression [F32.9]    History of Present Illness:: Patient is a 54 year old  Married male, seen with NP and with RN. At present patient presents poorly related, irritable, refusing to answer most questions, providing minimal information at this time. His eye contact is poor, states " I don't trust you, I am done talking with you." As discussed with RN / staff, his presentation has been variable, at times more cooperative , interactive and at others angry, hostile, for example closing door on staff .  Patient did provide verbal consent to speak with his wife and she provided significant collateral information. Patient  Has a history of Bipolar Disorder. As per report he also has a history of alcohol abuse , but had stopped drinking  In September and wife does not feel he has relapsed . (   BAL was negative upon admission). He was presenting with increased anxiety, psychomotor restlessness and poor sleep. These symptoms worsened and were more noticeable over the week prior to admission. Wife emphasizes he seemed anxious, fearful and had severe insomnia. Patient has been on Depakote ER for several years, and had also been on Risperidone, but it had recently been changed to Seroquel. He presented to the ER complaining of racing thoughts, inability to function , insomnia .   Associated  Signs/Symptoms: Depression Symptoms:  Patient does not endorse - as noted, insomnia has been noted as a major symptom. As per RN , also has had episodes of tearfulness  (Hypo) Manic Symptoms:   Poor sleep, racing thoughts, restlessness  Anxiety Symptoms:   Report is that patient has been reporting being  anxious prior to admission Psychotic Symptoms:   Patient does not endorse hallucinations, and does not appear internally preoccupied- but does present guarded, irritable, hostile and making statements such as that he does not trust staff . When asked why he felt he could not trust staff  , stated " Jared Molina is Lord "  PTSD Symptoms:  does not endorse  Total Time spent with patient: 45 minutes  Past Psychiatric History: As per wife, chart, patient had one prior admission in 2006, for similar presentation. No known history of suicide attempts, no known prior history of psychosis. As noted, recent psychiatric medication changes- he follows up at [facility] for outpatient medication management .  Risk to Self: Is patient at risk for suicide?: No Risk to Others:   Prior Inpatient Therapy:   Prior Outpatient Therapy:    Alcohol Screening: 1. How often do you have a drink containing alcohol?: Never 9. Have you or someone else been injured as a result of your drinking?: No 10. Has a relative or friend or a doctor or another health worker been concerned about your drinking or suggested you cut down?: Yes, during the last year Alcohol Use Disorder Identification Test Final Score (AUDIT): 4 Brief Intervention: AUDIT score less than 7 or less-screening does not suggest unhealthy drinking-brief intervention not indicated (Patient has been sober for **Note De-Identified Tuckett Obfuscation** 2 months.) Substance Abuse History in the last 12 months: yes, history of alcohol abuse, but stopped in September 2016.  No known history of drug abuse, and  UDS negative .  Consequences of Substance Abuse: Unknown at this time  Previous  Psychotropic Medications:  Has been on Depakote ER for years. Had been on Risperidone for years, but recently changed to  Seroquel  Psychological Evaluations: No Past Medical History: No known medical illness, NKDA, stopped smoking cigarettes recently Past Medical History  Diagnosis Date  . Depression   . Bipolar 1 disorder (HCC)    No past surgical history on file. Family History:  Father has dementia, mother doing well, one sister may have history of mental illness . No known suicides in family Family History  Problem Relation Age of Onset  . Heart disease Father   . Hyperlipidemia Father   . Heart disease Maternal Grandfather   . Hyperlipidemia Maternal Grandfather   . Cancer Paternal Grandmother     lung cancer   Family Psychiatric  History: see above  Social History: married x 15 years, has one adult daughter , lives with wife, works from home mostly, in [employer], no legal issues. Wife cannot identify any specific psychosocial stressors that may be contributing to decompensation History  Alcohol Use No    Comment: quit drinking in Sept 2016     History  Drug Use No    Social History   Social History  . Marital Status: Married    Spouse Name: N/A  . Number of Children: N/A  . Years of Education: N/A   Social History Main Topics  . Smoking status: Former Smoker    Quit date: 11/24/2013  . Smokeless tobacco: Not on file  . Alcohol Use: No     Comment: quit drinking in Sept 2016  . Drug Use: No  . Sexual Activity: Yes   Other Topics Concern  . Not on file   Social History Narrative   Married.   Additional Social History:  Allergies:  No Known Allergies Lab Results:  Results for orders placed or performed during the hospital encounter of 08/13/15 (from the past 48 hour(s))  TSH     Status: None   Collection Time: 08/13/15  6:22 PM  Result Value Ref Range   TSH 0.892 0.350 - 4.500 uIU/mL    Comment: Performed at [facility]  Valproic acid  level     Status: None   Collection Time: 08/13/15  6:22 PM  Result Value Ref Range   Valproic Acid Lvl 65 50.0 - 100.0 ug/mL    Comment: Performed at [facility]    Metabolic Disorder Labs:  No results found for: HGBA1C, MPG No results found for: PROLACTIN Lab Results  Component Value Date   CHOL 139 04/05/2015   TRIG 123 04/05/2015   HDL 50 04/05/2015   CHOLHDL 2.8 04/05/2015   VLDL 25 04/05/2015   LDLCALC 64 04/05/2015   LDLCALC 73 12/29/2013    Current Medications: Current Facility-Administered Medications  Medication Dose Route Frequency Provider Last Rate Last Dose  . acetaminophen (TYLENOL) tablet 650 mg  650 mg Oral Q6H PRN Oneta Rack, NP      . alum & mag hydroxide-simeth (MAALOX/MYLANTA) 200-200-20 MG/5ML suspension 30 mL  30 mL Oral Q4H PRN Oneta Rack, NP      . divalproex (DEPAKOTE ER) 24 hr tablet 1,500 mg  1,500 mg Oral QHS Oneta Rack, NP **Note De-Identified Finch Obfuscation** 1,500 mg at 08/13/15 2200  . hydrOXYzine (ATARAX/VISTARIL) tablet 50 mg  50 mg Oral Q6H PRN Oneta Rackanika N Lewis, NP   50 mg at 08/13/15 1815  . magnesium hydroxide (MILK OF MAGNESIA) suspension 30 mL  30 mL Oral Daily PRN Oneta Rackanika N Lewis, NP      . OLANZapine zydis (ZYPREXA) disintegrating tablet 5 mg  5 mg Oral Q8H PRN Thermon LeylandLaura A Davis, NP   5 mg at 08/14/15 1103  . simvastatin (ZOCOR) tablet 40 mg  40 mg Oral QPC supper Oneta Rackanika N Lewis, NP   40 mg at 08/13/15 1815  . traZODone (DESYREL) tablet 100 mg  100 mg Oral QHS PRN Thermon LeylandLaura A Davis, NP   100 mg at 08/13/15 2201   PTA Medications: Prescriptions prior to admission  Medication Sig Dispense Refill Last Dose  . divalproex (DEPAKOTE ER) 500 MG 24 hr tablet Take 1,500 mg by mouth at bedtime.    08/12/2015 at Unknown time  . hydrOXYzine (ATARAX/VISTARIL) 50 MG tablet Take 1 tablet (50 mg total) by mouth 3 (three) times daily as needed. 30 tablet 0 08/12/2015 at Unknown time  . simvastatin (ZOCOR) 40 MG tablet TAKE 1 TABLET (40 MG TOTAL) BY MOUTH DAILY. 30  tablet 11 08/12/2015 at Unknown time    Musculoskeletal: Strength & Muscle Tone: within normal limits Gait & Station: normal Patient leans: N/A  Psychiatric Specialty Exam: Physical Exam  ROS difficult to obtain at this time , as patient not cooperative with interview at this time.  Blood pressure 140/94, pulse 92, temperature 98.7 F (37.1 C), temperature source Oral, resp. rate 18, height 5\' 11"  (1.803 m), weight 195 lb (88.451 kg), SpO2 99 %.Body mass index is 27.21 kg/(m^2).  General Appearance: Fairly Groomed  Patent attorneyye Contact::  Fair  Speech:  mostly selectively mute, at times speaks in short sentences   Volume:  Decreased  Mood:  Irritable  Affect:  irritable,angry  Thought Process:  difficult to assess, has complained of sense of racing thoughts earlier   Orientation:  Other:   difficult to ascertain-  as refuses to answer most questions, does state " I know I am in a hospital"   Thought Content:  does not endorse hallucinations, and does not appear internally preoccupied . Presents guarded, paranoid, as above   Suicidal Thoughts:  No denies suicidal ideations   Homicidal Thoughts:   Does not endorse   Memory:  difficult to ascertain, due to lack of cooperation with interview   Judgement:  Poor  Insight:  Lacking  Psychomotor Activity:  intermittently restless, pacing   Concentration:  Fair   Recall:  Poor  Fund of Knowledge:Poor  Language: Poor  Akathisia:  Negative  Handed:  Right  AIMS (if indicated):     Assets:  Housing Resilience Social Support  ADL's:   Fair   Cognition: WNL  Sleep:        Treatment Plan Summary: Daily contact with patient to assess and evaluate symptoms and progress in treatment, Medication management, Plan inpatient admission and  medications as below   Observation Level/Precautions:  15 minute checks  Laboratory:  as needed   Psychotherapy:  Milieu, support   Medications:   At this time continue Depakote ER 1500 mgrs QHS-  patient has  been on this medication for years with no history of side effects and report is that he has been stable for a long time. Valproic Acid Serum level is within therapeutic  At this time continue Zyprexa  5 **Note De-Identified [place]ton Obfuscation** mgrs Q 8  Hours PRN for Agitation / Psychosis/Severe Anxiety Decrease Vistaril to 25 mgrs Q 8  Hours PRN for anxiety  Consultations:  As needed   Discharge Concerns: -   Estimated LOS: 7 days   Other:     I certify that inpatient services furnished can reasonably be expected to improve the patient's condition.   Keilen Kahl 11/20/20161:54 PM

## 2015-08-14 NOTE — [facility] Group Notes (Addendum)
**Note De-Identified Dicarlo Obfuscation** [facility] Group Notes: (Clinical Social Work)   08/14/2015      Type of Therapy:  Group Therapy   Participation Level:  Did Not Attend despite MHT prompting   Ambrose MantleMareida Grossman-Orr, LCSW 08/14/2015, 2:51 PM

## 2015-08-14 NOTE — Progress Notes (Signed)
**Note De-Identified Deeds Obfuscation** D: Pt presents with flat affect and depressed mood. Pt repeatedly reported this morning that he is tired and no longer know what to do. Pt denies suicidal thoughts. Pt reported that he misses his family and that his children live out of state. Pt appears to have some mild confusion and requires reorienting and redirecting. Pt is A&O x4. Pt forgetful at times, repeat responses and easily agitated. Pt reported that he needed to sleep because he haven't slept in days. Writer allowed for pt to sleep all morning. Writer administered Zyprexa to pt for agitation.  A: Medications administered as ordered per MD. Verbal support given. Pt encouraged to attend groups. 15 minute checks performed for safety. R: Pt safety maintained.

## 2015-08-15 DIAGNOSIS — F3176 Bipolar disorder, in full remission, most recent episode depressed: Secondary | ICD-10-CM | POA: Diagnosis present

## 2015-08-15 DIAGNOSIS — F1020 Alcohol dependence, uncomplicated: Secondary | ICD-10-CM

## 2015-08-15 DIAGNOSIS — F3163 Bipolar disorder, current episode mixed, severe, without psychotic features: Principal | ICD-10-CM

## 2015-08-15 DIAGNOSIS — F1021 Alcohol dependence, in remission: Secondary | ICD-10-CM | POA: Diagnosis present

## 2015-08-15 MED ORDER — OLANZAPINE 5 MG PO TABS
5.0000 mg | ORAL_TABLET | Freq: Three times a day (TID) | ORAL | Status: DC | PRN
  Administered 2015-08-17 – 2015-08-18 (×2): 5 mg via ORAL
  Filled 2015-08-15 (×2): qty 2

## 2015-08-15 MED ORDER — OLANZAPINE 5 MG PO TABS
5.0000 mg | ORAL_TABLET | Freq: Every day | ORAL | Status: DC
  Administered 2015-08-15: 5 mg via ORAL
  Filled 2015-08-15 (×2): qty 1

## 2015-08-15 MED ORDER — OLANZAPINE 10 MG IM SOLR: 5.0000 mg | Freq: Three times a day (TID) | INTRAMUSCULAR | Status: DC | PRN

## 2015-08-15 NOTE — Progress Notes (Signed)
**Note De-Identified Piontek Obfuscation** Pt complained of headache, dizziness and general weakness, VS taken BP 123/79, P 81. Tylenol 650 mg PO given. Pt was offered drinks and encouraged to rest. NP notified.

## 2015-08-15 NOTE — Progress Notes (Signed)
**Note De-Identified Mcc[place]hey Obfuscation** [facility] MD Progress Note  08/15/2015 3:18 PM Jared Molina  MRN:  [id number] Subjective:  Patient states " I am having these mood swings . I am not sure why .'  Objective:Patient is a 54 yo caucasian male who presented to [facility] with his wife. Per initial notes in EHR " Patient reported racing thoughts, insomnia, depression and inability to function. Patient admitted to [facility] for further inpatient treatment. Patient states he was diagnosed with Bipolar 1 in his late 61s. Patient was taking depakote and lithium until he stopped taking them in the 1990s. Patient was also abusing alcohol and recently stopped in September. He has been attending AA for support. Patient states, "since I stopped drinking, I'm getting worse."   Patient seen and chart reviewed today .Discussed patient with treatment team.  Pt today seen as calm , cooperative , but reports continued mood lability as well as anxiety. Pt reports that when he started abusing alcohol his mood swings got worse , now that he stopped , he still feels his mood is very labile. He can be happy and euphoric on the same day. Pt reports that his out pt provider - Dr.Jennings started him on Depakote, he is tolerating it well. Pt also reports sleep issues. Pt otherwise denied SI/HI/AH/VH- not seen as responding to internal stimuli. Pt is alert, ox3, memory intact- concentration seems to be intact.      Principal Problem: Bipolar disorder, curr episode mixed, severe, w/o psychotic features (HCC) Diagnosis:   Patient Active Problem List   Diagnosis Date Noted  . Bipolar disorder, curr episode mixed, severe, w/o psychotic features (HCC) [F31.63] 08/15/2015  . Alcohol use disorder, moderate, dependence (HCC) [F10.20] 08/15/2015  . Smoker [Z72.0] 08/26/2012  . Thyroid nodule [E04.1] 08/26/2012  . Hyperlipidemia [E78.5] 08/26/2012  . EtOH dependence (HCC) [F10.20] 08/26/2012   Total Time spent with patient: 30 minutes  Past Psychiatric History: Pt  reports a hx of Bipolar do , has had several hospitalizations. 4x in [place] , [place]. Pt follows [facility].  Past Medical History:  Past Medical History  Diagnosis Date  . Depression   . Bipolar 1 disorder (HCC)    Family History:  Family History  Problem Relation Age of Onset  . Heart disease Father   . Hyperlipidemia Father   . Heart disease Maternal Grandfather   . Hyperlipidemia Maternal Grandfather   . Cancer Paternal Grandmother     lung cancer   Family Psychiatric  History: Pt reports that his sister has a hx of depression. Pt denies any hx of suicide in the family, denies substance abuse in family.  Social History: Pt lives with his wife. Pt denies any legal issues.  History  Alcohol Use No    Comment: quit drinking in Sept 2016     History  Drug Use No    Social History   Social History  . Marital Status: Married    Spouse Name: N/A  . Number of Children: N/A  . Years of Education: N/A   Social History Main Topics  . Smoking status: Former Smoker    Quit date: 11/24/2013  . Smokeless tobacco: None  . Alcohol Use: No     Comment: quit drinking in Sept 2016  . Drug Use: No  . Sexual Activity: Yes   Other Topics Concern  . None   Social History Narrative   Married.   Additional Social History: **Note De-Identified Goding Obfuscation** Sleep: Poor  Appetite:  Fair  Current Medications: Current Facility-Administered Medications  Medication Dose Route Frequency Provider Last Rate Last Dose  . acetaminophen (TYLENOL) tablet 650 mg  650 mg Oral Q6H PRN Oneta Rack, NP   650 mg at 08/15/15 0821  . alum & mag hydroxide-simeth (MAALOX/MYLANTA) 200-200-20 MG/5ML suspension 30 mL  30 mL Oral Q4H PRN Oneta Rack, NP      . divalproex (DEPAKOTE ER) 24 hr tablet 1,500 mg  1,500 mg Oral QHS Oneta Rack, NP   1,500 mg at 08/13/15 2200  . hydrOXYzine (ATARAX/VISTARIL) tablet 50 mg  50 mg Oral Q6H PRN Oneta Rack, NP   50 mg at 08/13/15 1815  .  magnesium hydroxide (MILK OF MAGNESIA) suspension 30 mL  30 mL Oral Daily PRN Oneta Rack, NP      . OLANZapine (ZYPREXA) tablet 5 mg  5 mg Oral Q8H PRN Jomarie Longs, MD       Or  . OLANZapine (ZYPREXA) injection 5 mg  5 mg Intramuscular Q8H PRN Jodee Wagenaar, MD      . OLANZapine (ZYPREXA) tablet 5 mg  5 mg Oral QHS Dmoni Fortson, MD      . simvastatin (ZOCOR) tablet 40 mg  40 mg Oral QPC supper Oneta Rack, NP   40 mg at 08/14/15 1855  . traZODone (DESYREL) tablet 100 mg  100 mg Oral QHS PRN Thermon Leyland, NP   100 mg at 08/13/15 2201    Lab Results:  Results for orders placed or performed during the hospital encounter of 08/13/15 (from the past 48 hour(s))  TSH     Status: None   Collection Time: 08/13/15  6:22 PM  Result Value Ref Range   TSH 0.892 0.350 - 4.500 uIU/mL    Comment: Performed at [facility]  Valproic acid level     Status: None   Collection Time: 08/13/15  6:22 PM  Result Value Ref Range   Valproic Acid Lvl 65 50.0 - 100.0 ug/mL    Comment: Performed at [facility]    Physical Findings: AIMS: Facial and Oral Movements Muscles of Facial Expression: None, normal Lips and Perioral Area: None, normal Jaw: None, normal Tongue: None, normal,Extremity Movements Upper (arms, wrists, hands, fingers): None, normal Lower (legs, knees, ankles, toes): None, normal, Trunk Movements Neck, shoulders, hips: None, normal, Overall Severity Severity of abnormal movements (highest score from questions above): None, normal Incapacitation due to abnormal movements: None, normal Patient's awareness of abnormal movements (rate only patient's report): No Awareness, Dental Status Current problems with teeth and/or dentures?: No Does patient usually wear dentures?: No  CIWA:    COWS:     Musculoskeletal: Strength & Muscle Tone: within normal limits Gait & Station: normal Patient leans: N/A  Psychiatric Specialty Exam: Review of  Systems  Psychiatric/Behavioral: Positive for depression and substance abuse. The patient is nervous/anxious and has insomnia.   All other systems reviewed and are negative.   Blood pressure 135/88, pulse 108, temperature 98.7 F (37.1 C), temperature source Oral, resp. rate 18, height  (1.803 m), weight 88.451 kg (195 lb), SpO2 99 %.Body mass index is 27.21 kg/(m^2).  General Appearance: Fairly Groomed  Patent attorney::  Fair  Speech:  Normal Rate  Volume:  Decreased  Mood:  Anxious  Affect:  Flat  Thought Process:  Goal Directed  Orientation:  Full (Time, Place, and Person)  Thought Content:  Rumination  Suicidal Thoughts: **Note De-Identified Mruk Obfuscation** No  Homicidal Thoughts:  No  Memory:  Immediate;   Fair Recent;   Fair Remote;   Fair  Judgement:  Fair  Insight:  Fair  Psychomotor Activity:  Restlessness  Concentration:  Fair  Recall:  FiservFair  Fund of Knowledge:Fair  Language: Fair  Akathisia:  No  Handed:  Right  AIMS (if indicated):     Assets:  Communication Skills Desire for Improvement  ADL's:  Intact  Cognition: WNL  Sleep:      Treatment Plan Summary:Patient continues to report mood swings as well as sleep issues. Will readjust medications. Daily contact with patient to assess and evaluate symptoms and progress in treatment and Medication management  Reviewed past medical records,treatment plan.  Will continue Depakote ER 1500 mg po qhs for mood swings. Depakote level on 08/17/15. Will add Zyprexa 5 mg po qhs for mood lability, augment depakote. Will continue Zyprexa 5 mg po q8h prn for severe anxiety/agitation. Will continue Trazodone 100 mg po qhs prn for sleep. Will continue to monitor vitals ,medication compliance and treatment side effects while patient is here.  Will monitor for medical issues as well as call consult as needed.  Reviewed labs - TSH - wnl ,will order EKG, lipid panel, hba1c, PL.  CSW will start working on disposition.  Patient to participate in therapeutic milieu .        Quiana Cobaugh MD 08/15/2015, 3:18 PM

## 2015-08-15 NOTE — [facility] Group Notes (Signed)
**Note De-Identified Sevey Obfuscation** [facility] Group Notes:  (Counselor/Nursing/MHT/Case Management/Adjunct)  08/15/2015 1:15PM  Type of Therapy:  Group Therapy  Participation Level:  Active  Participation Quality:  Appropriate  Affect:  Flat  Cognitive:  Oriented  Insight:  Improving  Engagement in Group:  Limited  Engagement in Therapy:  Limited  Modes of Intervention:  Discussion, Exploration and Socialization  Summary of Progress/Problems: The topic for group was balance in life.  Pt participated in the discussion about when their life was in balance and out of balance and how this feels.  Pt discussed ways to get back in balance and short term goals they can work on to get where they want to be. Invited.  Chose to not attend   Ida Rogueorth, Tomio Kirk B 08/15/2015 3:35 PM

## 2015-08-15 NOTE — Progress Notes (Signed)
**Note De-Identified Gentle Obfuscation** DAR NOTE: Patient presents with anxious affect and depressed mood.  Denies pain, auditory and visual hallucinations.  Rates depression at 5, hopelessness at 5, and anxiety at 5.  Maintained on routine safety checks.  Medications given as prescribed.  Support and encouragement offered as needed.  Attended group and participated.  States goal for today is " relax".  Patient observed socializing with peers in the dayroom.  Offered no complaint.

## 2015-08-15 NOTE — Plan of Care (Signed)
**Note De-Identified Bergey Obfuscation** Problem: Alteration in mood Goal: LTG-[name] behavior demonstrates decreased signs of depression (Patient's behavior demonstrates decreased signs of depression to the point the patient is safe to return home and continue treatment in an outpatient setting)  Outcome: Progressing Pt is still depressed but responding well to medications.

## 2015-08-15 NOTE — Treatment Plan (Signed)
**Note De-Identified Depp Obfuscation** Met with pt, his wife, and Dr. Parke Poisson to discuss psych history and get collateral info on what led to hospitalization.  Went down to pt's room to find him sitting in the doorway to room with flat affect, but once he saw his wife his stood up and brightened slightly.  Dr. Parke Poisson discussed with pt went he and pt's wife had discussed regarding recent med changes and recent insomnia related to stress that led to decompensating.  Pt agreed to take Zyprexa, which had been offered earlier, but refused.  Pt was oriented to time, place, person, and situation.

## 2015-08-15 NOTE — Progress Notes (Signed)
**Note De-Identified Delawder Obfuscation** D: Pt in bed resting with eyes closed. Respirations even and unlabored. Pt appears to be in no signs of distress at this time. A: Q1315min checks remains for this pt. R: Pt remains safe at this time.   Pt was sedated and was not logical/coherent in speech during brief moments of communication with Clinical research associatewriter. Pt instead mumble and quickly returned to sleep during writer's attempt to awaken pt. Pt was not able to receive his dose of Depakote r/t such.

## 2015-08-15 NOTE — [facility] Group Notes (Signed)
**Note De-Identified Onley Obfuscation** [facility]   08/15/2015 3:30 PM  Participation Quality:  Minimal  Mood/Affect:  Flat  Depression Rating:  5  Anxiety Rating:  5  Thoughts of Suicide:  No Will you contract for safety?   NA  Current AVH:  No  Plan for Discharge/Comments:  Pt was reluctant to divulge personal information in group.  Stated that [facility] because he was feeling increasingly depressed since he gave up alcohol, and the adjustment did not work, so he agreed with his wife that he should come to the hospital.  Plans to return home, and follow up with same provider at d/c.  Transportation Means:   Supports:  Jared Molina, Jared Molina

## 2015-08-15 NOTE — Tx Team (Signed)
**Note De-Identified Sayer Obfuscation** Interdisciplinary Treatment Plan Update (Adult)  Date:  08/15/2015   Time Reviewed:  8:25 AM   Progress in Treatment: Attending groups: Yes. Participating in groups:  Yes. Taking medication as prescribed:  Yes. Tolerating medication:  Yes. Family/Significant other contact made:  Yes Patient understands diagnosis:  Yes  As evidenced by seeking help with depression, racing thoughts, insominia Discussing patient identified problems/goals with staff:  Yes, see initial care plan. Medical problems stabilized or resolved:  Yes. Denies suicidal/homicidal ideation: Yes. Issues/concerns per patient self-inventory:  No. Other:  New problem(s) identified:  Discharge Plan or Barriers:  See below  Reason for Continuation of Hospitalization: Anxiety Depression Medication stabilization  Comments:  Pt has been diagnosed with bipolar and depression also alcohol dependence. Pt has had hospital admissions to psychiatric hospitals in the past. Most recent was 2006. Pt stopped drinking in September and feels he is having a hard time functioning. [name] Psych MD has changed meds recently. Pt has racing thoughts, insomnia, depression, unable to function at baseline. Pt feels trapped in his head at times and unable to get out his thoughts. [name] wife at bedside, very supportive. Pt denies any SI or HI.  Depakote, Zyprexa trial  Estimated length of stay: 2-4 days  New goal(s):  Review of initial/current patient goals per problem list:   Review of initial/current patient goals per problem list:  1. Goal(s): Patient will participate in aftercare plan   Met: Yes   Target date: 3-5 days post admission date   As evidenced by: Patient will participate within aftercare plan AEB aftercare provider and housing plan at discharge being identified.  08/15/2015: Return home, follow up with current provider   2. Goal (s): Patient will exhibit decreased depressive symptoms and suicidal ideations.   Met:  No   Target date: 3-5 days post admission date   As evidenced by: Patient will utilize self rating of depression at 3 or below and demonstrate decreased signs of depression or be deemed stable for discharge by MD. 08/15/15:  Presents as grim, sad and rates depression a 5     3. Goal(s): Patient will demonstrate decreased signs and symptoms of anxiety.   Met: No   Target date: 3-5 days post admission date   As evidenced by: Patient will utilize self rating of anxiety at 3 or below and demonstrated decreased signs of anxiety, or be deemed stable for discharge by MD 08/15/15:  Rates anxiety a 5 today          Attendees: Patient:  08/15/2015 8:25 AM   Family:   08/15/2015 8:25 AM   Physician:  Ursula Alert, MD 08/15/2015 8:25 AM   Nursing:   Hedy Jacob, RN 08/15/2015 8:25 AM   CSW:    Roque Lias, LCSW   08/15/2015 8:25 AM   Other:  08/15/2015 8:25 AM   Other:   08/15/2015 8:25 AM   Other:  Lars Pinks, Nurse CM 08/15/2015 8:25 AM   Other:   08/15/2015 8:25 AM   Other:  Norberto Sorenson, [place]  08/15/2015 8:25 AM   Other:  08/15/2015 8:25 AM   Other:  08/15/2015 8:25 AM   Other:  08/15/2015 8:25 AM   Other:  08/15/2015 8:25 AM   Other:  08/15/2015 8:25 AM   Other:   08/15/2015 8:25 AM    Scribe for Treatment Team:   Trish Mage, 08/15/2015 8:25 AM

## 2015-08-16 LAB — LIPID PANEL
CHOL/HDL RATIO: 3.1 ratio
Cholesterol: 119 mg/dL (ref 0–200)
HDL: 38 mg/dL — AB (ref >40)
LDL Cholesterol: 59 mg/dL (ref 0–99)
Triglycerides: 108 mg/dL (ref <150)
VLDL: 22 mg/dL (ref 0–40)

## 2015-08-16 LAB — HEMOGLOBIN A1C
Hgb A1c MFr Bld: 5.6 % (ref 4.8–5.6)
Mean Plasma Glucose: 114 mg/dL

## 2015-08-16 MED ORDER — OLANZAPINE 7.5 MG PO TABS
7.5000 mg | ORAL_TABLET | Freq: Every day | ORAL | Status: DC
  Administered 2015-08-16 – 2015-08-17 (×2): 7.5 mg via ORAL
  Filled 2015-08-16 (×4): qty 1

## 2015-08-16 NOTE — Progress Notes (Signed)
**Note De-Identified Baucom Obfuscation** DAR NOTE: Patient calm and pleasant.  Patient stayed in his room most of this shift.  Reports lightheaded, dizziness and headaches on self inventory form. Refused pain medication when offered.  Rates depression at 7, hopelessness at 4, and anxiety at 6.  Maintained on routine safety checks.  Medications given as prescribed.  Support and encouragement offered as needed.  Did not attend group due to complaint.  States goal for today is "relaxation."

## 2015-08-16 NOTE — Progress Notes (Signed)
**Jared Molina De-Identified Jared Jared Molina Obfuscation** Jared Jared Molina  08/16/2015 12:27 PM Jared Jared Molina  MRN:  [id number] Subjective:  Patient states " I still feel depressed. I feel like I have no energy. Pt broke down and cried during the evaluation and stated " I have been wanting to do this for a long time.'   Objective:Patient is a 54 yo caucasian male who presented to [facility] with his wife. Per initial notes in EHR " Patient reported racing thoughts, insomnia, depression and inability to function. Patient admitted to [facility] for further inpatient treatment. Patient states he was diagnosed with Bipolar 1 in his late 87s. Patient was taking depakote and lithium until he stopped taking them in the 1990s. Patient was also abusing alcohol and recently stopped in September. He has been attending AA for support. Patient states, "since I stopped drinking, I'm getting worse."   Patient seen and chart reviewed today .Discussed patient with treatment team.  Pt today seen as calm , cooperative , but continues to be depressed. Pt continues to appear internally preoccupied , however was tearful and stated he wanted to do that for a long time. Pt tolerating his depakote and zyprexa well.. Depakote level due tomorrow. Pt continues to have BL fine tremors , likely due to hx of alcohol use do, however denies other withdrawal sx. Pt advised to participate in groups and take his medications. Per staff - pt has been complaining of a headache and has been making use of tylenol. 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 He continues to have headaches as well as tinnitus on and off. Pt is alert, ox3, memory intact- concentration seems to be intact.      Principal Problem: Bipolar disorder, curr episode mixed, severe, w/o psychotic features (HCC) Diagnosis:   Patient Active Problem List   Diagnosis Date Noted  . Bipolar disorder, curr episode mixed, severe, w/o psychotic features (HCC) [F31.63] 08/15/2015   . Alcohol use disorder, moderate, dependence (HCC) [F10.20] 08/15/2015  . Smoker [Z72.0] 08/26/2012  . Thyroid nodule [E04.1] 08/26/2012  . Hyperlipidemia [E78.5] 08/26/2012  . EtOH dependence (HCC) [F10.20] 08/26/2012   Total Time spent with patient: 30 minutes  Past Psychiatric History: Pt reports a hx of Bipolar do , has had several hospitalizations. 4x in [place] , [place]. Pt follows up with [facility].  Past Medical History:  Past Medical History  Diagnosis Date  . Depression   . Bipolar 1 disorder (HCC)    Family History:  Family History  Problem Relation Age of Onset  . Heart disease Father   . Hyperlipidemia Father   . Heart disease Maternal Grandfather   . Hyperlipidemia Maternal Grandfather   . Cancer Paternal Grandmother     lung cancer   Family Psychiatric  History: Pt reports that his sister has a hx of depression. Pt denies any hx of suicide in the family, denies substance abuse in family.  Social History: Pt lives with his wife. Pt denies any legal issues.  History  Alcohol Use No    Comment: quit drinking in Sept 2016     History  Drug Use No    Social History   Social History  . Marital Status: Married    Spouse Name: N/A  . Number of Children: N/A  . Years of Education: N/A   Social History Main Topics  . Smoking status: Former Smoker    Quit date: 11/24/2013  . Smokeless tobacco: None  . Alcohol Use: **Jared Molina De-Identified Jared Molina Obfuscation** No     Comment: quit drinking in Sept 2016  . Drug Use: No  . Sexual Activity: Yes   Other Topics Concern  . None   Social History Narrative   Married.   Additional Social History:                         Sleep: Fair  Appetite:  Fair  Current Medications: Current Facility-Administered Medications  Medication Dose Route Frequency Provider Last Rate Last Dose  . acetaminophen (TYLENOL) tablet 650 mg  650 mg Oral Q6H PRN Oneta Rack, NP   650 mg at 08/15/15 1821  . alum & mag hydroxide-simeth (MAALOX/MYLANTA) 200-200-20  MG/5ML suspension 30 mL  30 mL Oral Q4H PRN Oneta Rack, NP      . divalproex (DEPAKOTE ER) 24 hr tablet 1,500 mg  1,500 mg Oral QHS Oneta Rack, NP   1,500 mg at 08/15/15 2016  . hydrOXYzine (ATARAX/VISTARIL) tablet 50 mg  50 mg Oral Q6H PRN Oneta Rack, NP   50 mg at 08/13/15 1815  . magnesium hydroxide (MILK OF MAGNESIA) suspension 30 mL  30 mL Oral Daily PRN Oneta Rack, NP      . OLANZapine (ZYPREXA) tablet 5 mg  5 mg Oral Q8H PRN Jomarie Longs, MD       Or  . OLANZapine (ZYPREXA) injection 5 mg  5 mg Intramuscular Q8H PRN Bernal Luhman, MD      . OLANZapine (ZYPREXA) tablet 7.5 mg  7.5 mg Oral QHS Celia Friedland, MD      . simvastatin (ZOCOR) tablet 40 mg  40 mg Oral QPC supper Oneta Rack, NP   40 mg at 08/15/15 1820  . traZODone (DESYREL) tablet 100 mg  100 mg Oral QHS PRN Thermon Leyland, NP   100 mg at 08/13/15 2201    Lab Results:  Results for orders placed or performed during the hospital encounter of 08/13/15 (from the past 48 hour(s))  Hemoglobin A1c     Status: None   Collection Time: 08/15/15  6:36 AM  Result Value Ref Range   Hgb A1c MFr Bld 5.6 4.8 - 5.6 %    Comment: (Jared Molina)         Pre-diabetes: 5.7 - 6.4         Diabetes: >6.4         Glycemic control for adults with diabetes: <7.0    Mean Plasma Glucose 114 mg/dL    Comment: (Jared Molina) Performed At: [facility] [address] [place], [place] [id number] Mila Homer MD WU:[id number] Performed at [facility]   Lipid panel     Status: Abnormal   Collection Time: 08/16/15  7:20 AM  Result Value Ref Range   Cholesterol 119 0 - 200 mg/dL   Triglycerides 956 <213 mg/dL   HDL 38 (L) >08 mg/dL   Total CHOL/HDL Ratio 3.1 RATIO   VLDL 22 0 - 40 mg/dL   LDL Cholesterol 59 0 - 99 mg/dL    Comment:        Total Cholesterol/HDL:CHD Risk Coronary Heart Disease Risk Table                     Men   Women  1/2 Average Risk   3.4   3.3  Average Risk       5.0   4.4  2 X Average  Risk   9.6 **Jared [name]** 7.1  3 X Average Risk  23.4   11.0        Use the calculated Patient Ratio above and the CHD Risk Table to determine the patient's CHD Risk.        ATP III CLASSIFICATION (LDL):  <100     mg/dL   Optimal  161-096  mg/dL   Near or Above                    Optimal  130-159  mg/dL   Borderline  045-409  mg/dL   High  >811     mg/dL   Very High Performed at [facility]     Physical Findings: AIMS: Facial and Oral Movements Muscles of Facial Expression: None, normal Lips and Perioral Area: None, normal Jaw: None, normal Tongue: None, normal,Extremity Movements Upper (arms, wrists, hands, fingers): None, normal Lower (legs, knees, ankles, toes): None, normal, Trunk Movements Neck, shoulders, hips: None, normal, Overall Severity Severity of abnormal movements (highest score from questions above): None, normal Incapacitation due to abnormal movements: None, normal Patient's awareness of abnormal movements (rate only patient's report): No Awareness, Dental Status Current problems with teeth and/or dentures?: No Does patient usually wear dentures?: No  CIWA:    COWS:     Musculoskeletal: Strength & Muscle Tone: within normal limits Gait & Station: normal Patient leans: N/A  Psychiatric Specialty Exam: Review of Systems  Neurological: Positive for headaches.  Psychiatric/Behavioral: Positive for depression and substance abuse. The patient is nervous/anxious and has insomnia.   All other systems reviewed and are negative.   Blood pressure 117/80, pulse 112, temperature 98.4 F (36.9 C), temperature source Oral, resp. rate 20, height 5\' 11"  (1.803 m), weight 88.451 kg (195 lb), SpO2 99 %.Body mass index is 27.21 kg/(m^2).  General Appearance: Fairly Groomed  Patent attorney::  Fair  Speech:  Normal Rate  Volume:  Decreased  Mood:  Anxious and Depressed  Affect:  Congruent and Tearful  Thought Process:  Goal Directed  Orientation:  Full (Time, Place, and  Person)  Thought Content:  Rumination  Suicidal Thoughts:  No  Homicidal Thoughts:  No  Memory:  Immediate;   Fair Recent;   Fair Remote;   Fair  Judgement:  Fair  Insight:  Fair  Psychomotor Activity:  Restlessness  Concentration:  Fair  Recall:  [employer] of Knowledge:Fair  Language: Fair  Akathisia:  No  Handed:  Right  AIMS (if indicated):     Assets:  Communication Skills Desire for Improvement  ADL's:  Intact  Cognition: WNL  Sleep:  Number of Hours: 6.75   Treatment Plan Summary:Patient continues to report sadness as well as appears withdrawn , internally preoccupied. Will readjust medications. Daily contact with patient to assess and evaluate symptoms and progress in treatment and Medication management  Reviewed past medical records,treatment plan.  Will continue Depakote ER 1500 mg po qhs for mood swings. Depakote level on 08/17/15. Will increase Zyprexa to 7. 5 mg po qhs for mood lability, augment depakote. Will continue Zyprexa 5 mg po q8h prn for severe anxiety/agitation. Will continue Trazodone 100 mg po qhs prn for sleep. Tylenol as scheduled for pain management. Will continue to monitor vitals ,medication compliance and treatment side effects while patient is here.  Will monitor for medical issues as well as call consult as needed.  Reviewed labs - lipid panel - dyslipidemia , Hba1c- wnl - PL pending.EKG- wnl Dietician consult for dyslipidemia . Recreational therapy consult **Jared Molina De-Identified [place] Obfuscation** for support. CSW will start working on disposition.  Patient to participate in therapeutic milieu .       Jared Marano MD 08/16/2015, 12:27 PM

## 2015-08-16 NOTE — Progress Notes (Signed)
**Note De-Identified Laury Obfuscation** D: Pt who is alert and oriented x 4 is also isolative, flat and withdrawn to room. Pt endorses severe depression and anxiety; he states, "I don't know how I got to where am at today, I just don't understand and its making me very nervous; my wife was here today, that was good but now that I think about it, I don't like her seeing me like this." Pt also complained about insomnia; states, "I think one of my problems is not having enough sleep, I feel more in control now with the little sleep I just had." Pt denies pain and AVH. Pt remained slow and flat; however, cooperative during shift assessment.   A: Pt was offer 2200 medications earlier as Pt indicated that he was tired and ready to go to bed.  Support, encouragement, and safe environment provided.  15-minute safety checks continue.  R: Pt was med compliant.  Pt did not attend group. Safety checks continue

## 2015-08-16 NOTE — [facility] Group Notes (Signed)
**Note De-Identified Mcginnis Obfuscation** [facility] Group Notes:  (Nursing/MHT/Case Management/Adjunct)  Date:  08/16/2015  Time:  0930 Type of Therapy:  Nurse Education  Participation Level:  Did Not Attend                 Mickie Baillizabeth O Iwenekha 08/16/2015, 12:51 PM

## 2015-08-16 NOTE — Progress Notes (Signed)
**Note De-Identified Nole Obfuscation** Adult Psychoeducational Group Note  Date:  08/16/2015 Time:  8:35 PM  Group Topic/Focus:  Wrap-Up Group:   The focus of this group is to help patients review their daily goal of treatment and discuss progress on daily workbooks.  Participation Level:  Active  Participation Quality:  Appropriate  Affect:  Appropriate  Cognitive:  Appropriate  Insight: Appropriate  Engagement in Group:  Engaged  Modes of Intervention:  Discussion  Additional Comments:  Pt rated overall day a 6/7 out of 10 because he completed his goal of being around the milieu. Pt noted that the highlight of his day was learning that he was feeling better than yesterday.   Cleotilde NeerJasmine S Sashay Felling 08/16/2015, 11:19 PM

## 2015-08-16 NOTE — Progress Notes (Signed)
**Note De-Identified Lemus Obfuscation** Nutrition Brief Note  RD consulted for hyperlipidemia  Lipid Panel     Component Value Date/Time   CHOL 119 08/16/2015 0720   TRIG 108 08/16/2015 0720   HDL 38* 08/16/2015 0720   CHOLHDL 3.1 08/16/2015 0720   VLDL 22 08/16/2015 0720   LDLCALC 59 08/16/2015 0720    Pt is not appropriate for diet education at this point in time. Total cholesterol is within normal limits. HDL slightly outside of range.  Please re-consult if full assessment is needed. No nutrition interventions warranted at this time.  Dionne AnoWilliam M. Treyvin Glidden, MS, RD LDN After Hours/Weekend Pager [phone]

## 2015-08-16 NOTE — Progress Notes (Signed)
**Note De-Identified Milkovich Obfuscation** Recreation Therapy Notes  11.22.2016 Per MD request LRT met with patient to establish rapport and investigate additional treatment for patient. Patient explained that he was diagnosed with Bipolar in the 80's and associates a stigma with his diagnosis, which has prevented him from getting the help he needs. Patient shared that approximately 3 weeks ago he started feeling overwhelmingly depressed which caused him to start drinking. Patient reports as a result of his drinking he felt the need to seek psychiatric help.   Patient identified that he feels he needs help with his stress level and with investigating coping skills for the highs and lows he experiences. He identified his wife as a support person.   Patient agreed to participate in diaphragmatic breathing. After several attempts patient demonstrated ability to practice independently, but commented it was difficult. LRT instructed patient to continue to practice technique, using his medication times as a reminder to practice. Patient instructed him to practice sitting on his bed, following med pass, x5 breaths.   LRT will follow up during patient admission.  Laureen Ochs Yaretzy Olazabal, LRT/CTRS  Lane Hacker 08/16/2015 3:27 PM

## 2015-08-16 NOTE — [facility] Group Notes (Signed)
**Note De-Identified Morandi Obfuscation** [facility] LCSW Group Therapy   08/16/2015 1:15 PM   Type of Therapy: Group Therapy   Participation Level: Active   Participation Quality: Attentive, Sharing and Supportive   Affect: Appropriate  Cognitive: Alert and Oriented   Insight: Developing/Improving and Engaged   Engagement in Therapy: Developing/Improving and Engaged   Modes of Intervention: Clarification, Confrontation, Discussion, Education, Exploration,  Limit-setting, Orientation, Problem-solving, Rapport Building, Dance movement psychotherapisteality Testing, Socialization and Support  Summary of Progress/Problems: The topic for group therapy was feelings about community. Pt actively participated in group discussion on their past and current diagnosis and how they feel towards this. Pt also identified how society and family members judge them, based on their diagnosis as well as stereotypes and stigmas.  Pt was focused and attentive to the topics discussed, as well as the sharing of others.  Pt demonstrated support of the sharing of others, as evidenced by non-verbal affirmations offered to other group members in response to the sharing of others.  Minimal contributions on his own.  No spontaneous contributions, but responded when questioned directly.     Jared PeaJonathan F. Molina, LCSWA, LCAS

## 2015-08-17 LAB — PROLACTIN: Prolactin: 6.7 ng/mL (ref 4.0–15.2)

## 2015-08-17 LAB — HEMOGLOBIN A1C
HEMOGLOBIN A1C: 5.7 % — AB (ref 4.8–5.6)
MEAN PLASMA GLUCOSE: 117 mg/dL

## 2015-08-17 LAB — VALPROIC ACID LEVEL: VALPROIC ACID LVL: 85 ug/mL (ref 50.0–100.0)

## 2015-08-17 NOTE — [facility] Suicide Risk Assessment (Signed)
**Note De-Identified Panico Obfuscation** [facility] INPATIENT:  Family/Significant Other Suicide Prevention Education  Suicide Prevention Education:  Education Completed; No one has been identified by the patient as the family member/significant other with whom the patient will be residing, and identified as the person(s) who will aid the patient in the event of a mental health crisis (suicidal ideations/suicide attempt).  With written consent from the patient, the family member/significant other has been provided the following suicide prevention education, prior to the and/or following the discharge of the patient.  The suicide prevention education provided includes the following:  Suicide risk factors  Suicide prevention and interventions  National Suicide Hotline telephone number  [facility] assessment telephone number  [facility] Emergency Assistance 911  [facility] and/or Residential Mobile Crisis Unit telephone number  Request made of family/significant other to:  Remove weapons (e.g., guns, rifles, knives), all items previously/currently identified as safety concern.    Remove drugs/medications (over-the-counter, prescriptions, illicit drugs), all items previously/currently identified as a safety concern.  The family member/significant other verbalizes understanding of the suicide prevention education information provided.  The family member/significant other agrees to remove the items of safety concern listed above. The patient did not endorse SI at the time of admission, nor did the patient c/o SI during the stay here.  SPE not required. However, I did talk to his wife, Inetta Fermoina Beckom, [phone] about a crises plan.  Daryel Geraldorth, Pinkie Manger B 08/17/2015, 10:57 AM

## 2015-08-17 NOTE — [facility] Group Notes (Signed)
**Note De-Identified Marohl Obfuscation** [facility]  08/17/2015 , 2:05 PM    Type of Therapy:  [employer] Presentation  Participation Level:  Active  Participation Quality:  Attentive  Affect:  Blunted  Cognitive:  Oriented  Insight:  Limited  Engagement in Therapy:  Engaged  Modes of Intervention:  Discussion, Education and Socialization  Summary of Progress/Problems:  [name] from [employer] came to present his recovery story and play the guitar.  Engaged.  Stayed the entire time.  Jared Molina, Jared Molina 08/17/2015 , 2:05 PM

## 2015-08-17 NOTE — Progress Notes (Signed)
**Note De-Identified Wattenbarger Obfuscation** Assumed care of patient at 1500. Patient complaining of sensitivity to the milieu. "I just don't know why. My mood is better but I still feel like I'm up and down throughout the day. It's loud in here." Patient's affect, mood incongruent to his report. Patient states he wants to go home however states, "I'm not saying I'm well exactly." Offered support to patient. Reviewed coping skills. Medicated with prn zyprexa. Patient verbalized understanding. On reassess, patient states his wife is coming to visit and that he feels somewhat better after using some of his coping skills. Patient safe. Lawrence MarseillesFriedman, Kevonta Phariss Eakes

## 2015-08-17 NOTE — Progress Notes (Signed)
**Note De-Identified Gluth Obfuscation** D: Patient alert and oriented x 4. Patient denies pain/SI/HI/AVH. Patient was very inquisitive about his medications. Patient was pleasant, and explained this is not his first time being hospitalized, and he does not want to get like he was before.  A: Staff to monitor Q 15 mins for safety. Encouragement and support offered. Scheduled medications administered per orders. R: Patient remains safe on the unit. Patient attended group tonight. Patient visible on hte unit and interacting with peers. Patient taking administered medications.

## 2015-08-17 NOTE — Progress Notes (Signed)
**Note De-Identified Abood Obfuscation** [facility] MD Progress Note  08/17/2015 2:31 PM Darryel Diodato  MRN:  [id number] Subjective:  Patient states " I feel tired - mostly because I am not used to staying inside like this . I have been sleeping better.'  Objective:Patient is a 54 yo caucasian male who presented to [facility] with his wife. Per initial notes in EHR " Patient reported racing thoughts, insomnia, depression and inability to function. Patient admitted to [facility] for further inpatient treatment. Patient states he was diagnosed with Bipolar 1 in his late 54s. Patient was taking depakote and lithium until he stopped taking them in the 1990s. Patient was also abusing alcohol and recently stopped in September. He has been attending AA for support. Patient states, "since I stopped drinking, I'm getting worse."   Patient seen and chart reviewed today .Discussed patient with treatment team.  Pt today seen as calm , cooperative ,his restlessness as well as depression improved since admission. Pt also reports sleep as improved on his current medications. Per staff - pt has been compliant on his medications , denies any ADRs of medications . Will continue to encourage and support.       Principal Problem: Bipolar disorder, curr episode mixed, severe, w/o psychotic features (HCC) Diagnosis:   Patient Active Problem List   Diagnosis Date Noted  . Bipolar disorder, curr episode mixed, severe, w/o psychotic features (HCC) [F31.63] 08/15/2015  . Alcohol use disorder, moderate, dependence (HCC) [F10.20] 08/15/2015  . Smoker [Z72.0] 08/26/2012  . Thyroid nodule [E04.1] 08/26/2012  . Hyperlipidemia [E78.5] 08/26/2012  . EtOH dependence (HCC) [F10.20] 08/26/2012   Total Time spent with patient: 30 minutes  Past Psychiatric History: Pt reports a hx of Bipolar do , has had several hospitalizations. 4x in [place] , [place]. [facility]  Past Medical History:  Past Medical History  Diagnosis Date  . Depression   . Bipolar 1  disorder (HCC)    Family History:  Family History  Problem Relation Age of Onset  . Heart disease Father   . Hyperlipidemia Father   . Heart disease Maternal Grandfather   . Hyperlipidemia Maternal Grandfather   . Cancer Paternal Grandmother     lung cancer   Family Psychiatric  History: Pt reports that his sister has a hx of depression. Pt denies any hx of suicide in the family, denies substance abuse in family.  Social History: Pt lives with his wife. Pt denies any legal issues.  History  Alcohol Use No    Comment: quit drinking in Sept 2016     History  Drug Use No    Social History   Social History  . Marital Status: Married    Spouse Name: N/A  . Number of Children: N/A  . Years of Education: N/A   Social History Main Topics  . Smoking status: Former Smoker    Quit date: 11/24/2013  . Smokeless tobacco: None  . Alcohol Use: No     Comment: quit drinking in Sept 2016  . Drug Use: No  . Sexual Activity: Yes   Other Topics Concern  . None   Social History Narrative   Married.   Additional Social History:                         Sleep: Fair  Appetite:  Fair  Current Medications: Current Facility-Administered Medications  Medication Dose Route Frequency Provider Last Rate Last Dose  . acetaminophen (TYLENOL) tablet 650 mg **Note De-Identified Rachal Obfuscation** 650 mg Oral Q6H PRN Oneta Rack, NP   650 mg at 08/15/15 1821  . alum & mag hydroxide-simeth (MAALOX/MYLANTA) 200-200-20 MG/5ML suspension 30 mL  30 mL Oral Q4H PRN Oneta Rack, NP      . divalproex (DEPAKOTE ER) 24 hr tablet 1,500 mg  1,500 mg Oral QHS Oneta Rack, NP   1,500 mg at 08/16/15 2122  . hydrOXYzine (ATARAX/VISTARIL) tablet 50 mg  50 mg Oral Q6H PRN Oneta Rack, NP   50 mg at 08/13/15 1815  . magnesium hydroxide (MILK OF MAGNESIA) suspension 30 mL  30 mL Oral Daily PRN Oneta Rack, NP      . OLANZapine (ZYPREXA) tablet 5 mg  5 mg Oral Q8H PRN Jomarie Longs, MD       Or  . OLANZapine (ZYPREXA)  injection 5 mg  5 mg Intramuscular Q8H PRN Monia Timmers, MD      . OLANZapine (ZYPREXA) tablet 7.5 mg  7.5 mg Oral QHS Angelyse Heslin, MD   7.5 mg at 08/16/15 2122  . simvastatin (ZOCOR) tablet 40 mg  40 mg Oral QPC supper Oneta Rack, NP   40 mg at 08/16/15 1810  . traZODone (DESYREL) tablet 100 mg  100 mg Oral QHS PRN Thermon Leyland, NP   100 mg at 08/13/15 2201    Lab Results:  Results for orders placed or performed during the hospital encounter of 08/13/15 (from the past 48 hour(s))  Hemoglobin A1c     Status: Abnormal   Collection Time: 08/16/15  7:20 AM  Result Value Ref Range   Hgb A1c MFr Bld 5.7 (H) 4.8 - 5.6 %    Comment: (NOTE)         Pre-diabetes: 5.7 - 6.4         Diabetes: >6.4         Glycemic control for adults with diabetes: <7.0    Mean Plasma Glucose 117 mg/dL    Comment: (NOTE) Performed At: [facility] [address] [place], [place] [id number] Mila Homer MD WU:[id number] Performed at [facility]   Prolactin     Status: None   Collection Time: 08/16/15  7:20 AM  Result Value Ref Range   Prolactin 6.7 4.0 - 15.2 ng/mL    Comment: (NOTE) Performed At: [facility] [address] [place], [place] [id number] Mila Homer MD VH:[id number] Performed at [facility]   Lipid panel     Status: Abnormal   Collection Time: 08/16/15  7:20 AM  Result Value Ref Range   Cholesterol 119 0 - 200 mg/dL   Triglycerides 413 <244 mg/dL   HDL 38 (L) >01 mg/dL   Total CHOL/HDL Ratio 3.1 RATIO   VLDL 22 0 - 40 mg/dL   LDL Cholesterol 59 0 - 99 mg/dL    Comment:        Total Cholesterol/HDL:CHD Risk Coronary Heart Disease Risk Table                     Men   Women  1/2 Average Risk   3.4   3.3  Average Risk       5.0   4.4  2 X Average Risk   9.6   7.1  3 X Average Risk  23.4   11.0        Use the calculated Patient Ratio above and the CHD Risk Table to determine the patient's CHD Risk. **Note De-Identified Finazzo Obfuscation** ATP  III CLASSIFICATION (LDL):  <100     mg/dL   Optimal  696-295  mg/dL   Near or Above                    Optimal  130-159  mg/dL   Borderline  284-132  mg/dL   High  >440     mg/dL   Very High Performed at [facility]   Valproic acid level     Status: None   Collection Time: 08/17/15  6:21 AM  Result Value Ref Range   Valproic Acid Lvl 85 50.0 - 100.0 ug/mL    Comment: Performed at [facility]    Physical Findings: AIMS: Facial and Oral Movements Muscles of Facial Expression: None, normal Lips and Perioral Area: None, normal Jaw: None, normal Tongue: None, normal,Extremity Movements Upper (arms, wrists, hands, fingers): None, normal Lower (legs, knees, ankles, toes): None, normal, Trunk Movements Neck, shoulders, hips: None, normal, Overall Severity Severity of abnormal movements (highest score from questions above): None, normal Incapacitation due to abnormal movements: None, normal Patient's awareness of abnormal movements (rate only patient's report): No Awareness, Dental Status Current problems with teeth and/or dentures?: No Does patient usually wear dentures?: No  CIWA:    COWS:     Musculoskeletal: Strength & Muscle Tone: within normal limits Gait & Station: normal Patient leans: N/A  Psychiatric Specialty Exam: Review of Systems  Neurological: Positive for headaches.  Psychiatric/Behavioral: Positive for depression and substance abuse. The patient is nervous/anxious and has insomnia.   All other systems reviewed and are negative.   Blood pressure 113/76, pulse 102, temperature 98.1 F (36.7 C), temperature source Oral, resp. rate 16, height  (1.803 m), weight 88.451 kg (195 lb), SpO2 99 %.Body mass index is 27.21 kg/(m^2).  General Appearance: Fairly Groomed  Patent attorney::  Fair  Speech:  Normal Rate  Volume:  Decreased  Mood:  Anxious and Depressed improving  Affect:  Congruent and Tearful  Thought Process:  Goal Directed   Orientation:  Full (Time, Place, and Person)  Thought Content:  Rumination  Suicidal Thoughts:  No  Homicidal Thoughts:  No  Memory:  Immediate;   Fair Recent;   Fair Remote;   Fair  Judgement:  Fair  Insight:  Fair  Psychomotor Activity:  Restlessness improving  Concentration:  Fair  Recall:  [employer] of Knowledge:Fair  Language: Fair  Akathisia:  No  Handed:  Right  AIMS (if indicated):     Assets:  Communication Skills Desire for Improvement  ADL's:  Intact  Cognition: WNL  Sleep:  Number of Hours: 6.75   Treatment Plan Summary:Patient today with improvement of his sx- will continues medications. Daily contact with patient to assess and evaluate symptoms and progress in treatment and Medication management  Reviewed past medical records,treatment plan.  Will continue Depakote ER 1500 mg po qhs for mood swings. Depakote level on 08/17/15- 85 ug/ml - therapeutic Will continue Zyprexa 7. 5 mg po qhs for mood lability, augment depakote. Will continue Zyprexa 5 mg po q8h prn for severe anxiety/agitation. Will continue Trazodone 100 mg po qhs prn for sleep. Tylenol as scheduled for pain management. Will continue to monitor vitals ,medication compliance and treatment side effects while patient is here.  Will monitor for medical issues as well as call consult as needed.  Reviewed labs - lipid panel - dyslipidemia , Hba1c- wnl - PL - wnl .EKG- wnl Dietician consult for dyslipidemia . **Note De-Identified Giammarino Obfuscation** Recreational therapy consult for support. CSW will start working on disposition. Patient to be discharged tomorrow if he remains stable . Patient to participate in therapeutic milieu .       Leaf Kernodle MD 08/17/2015, 2:31 PM

## 2015-08-17 NOTE — Tx Team (Signed)
**Note De-Identified Carachure Obfuscation** Interdisciplinary Treatment Plan Update (Adult)  Date:  08/17/2015   Time Reviewed:  10:45 AM   Progress in Treatment: Attending groups: Yes. Participating in groups:  Yes. Taking medication as prescribed:  Yes. Tolerating medication:  Yes. Family/Significant other contact made:  Yes Patient understands diagnosis:  Yes  As evidenced by seeking help with depression, racing thoughts, insominia Discussing patient identified problems/goals with staff:  Yes, see initial care plan. Medical problems stabilized or resolved:  Yes. Denies suicidal/homicidal ideation: Yes. Issues/concerns per patient self-inventory:  No. Other:  New problem(s) identified:  Discharge Plan or Barriers:  See below  Reason for Continuation of Hospitalization:   Comments:  Pt has been diagnosed with bipolar and depression also alcohol dependence. Pt has had hospital admissions to psychiatric hospitals in the past. Most recent was 2006. Pt stopped drinking in September and feels he is having a hard time functioning. [name] Psych MD has changed meds recently. Pt has racing thoughts, insomnia, depression, unable to function at baseline. Pt feels trapped in his head at times and unable to get out his thoughts. [name] wife at bedside, very supportive. Pt denies any SI or HI.  Depakote, Zyprexa trial  Estimated length of stay: Likely d/c tomorrow  New goal(s):  Review of initial/current patient goals per problem list:   Review of initial/current patient goals per problem list:  1. Goal(s): Patient will participate in aftercare plan   Met: Yes   Target date: 3-5 days post admission date   As evidenced by: Patient will participate within aftercare plan AEB aftercare provider and housing plan at discharge being identified.  08/17/2015: Return home, follow up with current provider   2. Goal (s): Patient will exhibit decreased depressive symptoms and suicidal ideations.   Met: Yes   Target date: 3-5 days post  admission date   As evidenced by: Patient will utilize self rating of depression at 3 or below and demonstrate decreased signs of depression or be deemed stable for discharge by MD. 08/15/15:  Presents as grim, sad and rates depression a 5 08/17/15:  Pt rates depression at a 2 today     3. Goal(s): Patient will demonstrate decreased signs and symptoms of anxiety.   Met: Yes   Target date: 3-5 days post admission date   As evidenced by: Patient will utilize self rating of anxiety at 3 or below and demonstrated decreased signs of anxiety, or be deemed stable for discharge by MD 08/15/15:  Rates anxiety a 5 today 08/17/15:  Rates anxiety at a 3 today          Attendees: Patient:  08/17/2015 10:45 AM   Family:   08/17/2015 10:45 AM   Physician:  Ursula Alert, MD 08/17/2015 10:45 AM   Nursing:   Hedy Jacob, RN 08/17/2015 10:45 AM   CSW:    Roque Lias, LCSW   08/17/2015 10:45 AM   Other:  08/17/2015 10:45 AM   Other:   08/17/2015 10:45 AM   Other:  Lars Pinks, Nurse CM 08/17/2015 10:45 AM   Other:   08/17/2015 10:45 AM   Other:  Norberto Sorenson, [place]  08/17/2015 10:45 AM   Other:  08/17/2015 10:45 AM   Other:  08/17/2015 10:45 AM   Other:  08/17/2015 10:45 AM   Other:  08/17/2015 10:45 AM   Other:  08/17/2015 10:45 AM   Other:   08/17/2015 10:45 AM    Scribe for Treatment Team:   Trish Mage, 08/17/2015 10:45 AM

## 2015-08-17 NOTE — Progress Notes (Signed)
**Note De-identified Prospero Obfuscation**  **Note De-Identified Servellon Obfuscation** [facility] Adult Case Management Discharge Plan :  [facility]  Yes,  home At discharge, do you have transportation home?: Yes,  wife Do you have the ability to pay for your medications: Yes,  insurance  Release of information consent forms completed and in the chart;  Patient's signature needed at discharge.  Patient to Follow up at: Follow-up Information    Follow up with [facility] On 08/25/2015.   Why:  Thursday at 3;40 with Dr Lesly DukesJennings   Contact information:   [address]  [place]  [phone]] [phone]      Follow up with [facility] On 08/22/2015.   Why:  Monday at 1:30 with Eyvonne MechanicLarry Willett   Contact information:   [address]      Next level of care provider has [facility]:  unknown  Patient denies SI/HI: Yes,  yes    Safety Planning and Suicide Prevention discussed: Yes,  yes  Have you used any form of tobacco in the last 30 days? (Cigarettes, Smokeless Tobacco, Cigars, and/or Pipes): Yes  Has patient been referred to the Quitline?: Yes, faxed on 08/17/15  Ida Rogueorth, Kirstan Fentress B 08/17/2015, 10:58 AM

## 2015-08-17 NOTE — Progress Notes (Signed)
**Note De-Identified Whittinghill Obfuscation** DAR NOTE: Patient presents with anxious affect and depressed mood.  Complain of ringing in the ear and lightheaded.  Denies auditory and visual hallucinations.  Rates depression at 7, hopelessness at 5, and anxiety at 10.  Maintained on routine safety checks. Support and encouragement offered as needed.  Attended group and participated.  States goal for today is "learn my illness and discuss treatment plan with dr Elna BreslowEappen."  Patient observed pacing back and forth on the unit few times.

## 2015-08-17 NOTE — [facility] Group Notes (Signed)
**Note De-Identified Fuster Obfuscation** [facility]   08/17/2015 3:08 PM  Participation Quality:  Limited  Mood/Affect:  Appropriate  Depression Rating:    Anxiety Rating:    Thoughts of Suicide:  No  Will you contract for safety?   Yes  Current AVH:  Denies  Plan for Discharge/Comments:   Pt was focused and attentive to the topics discussed and shared at length with the CSW about the pt's emotions regarding the pt's impending discharge.  Pt demonstrated extreme progress compared to the previous session, as evidenced by the client's demonstrated ability to clearly articulate the thoughts, feelings and emotions experienced by the pt, regarding progress the pt has made since being admitted and the pt's positive outlook on the pt's own ability to do well with therapy and medication management upon discharge.   Transportation Means: Pt reports he will be picked up bu his wife  Supports:  Pt reports good community supports  Kiribatiorth, WatersmeetRodney B

## 2015-08-17 NOTE — Progress Notes (Signed)
**Note De-Identified Zajicek Obfuscation** Recreation Therapy Notes  11.23.2016 @ approximately 2:15pm. LRT met with patient to follow up on stress management technique introduced in previous 1:1 session. Patient reported he used this technique at med passes and "a few other times" and found them to be helpful and calming. LRT introduced progressive muscle relaxation to patient, patient receptive to technique and reported upon completion he felt more relaxed. LRT encouraged patient to use post d/c and to ask his wife to help him remember to use them, as he identified her as a support person. Patient agreeable.   Laureen Ochs Dreonna Hussein, LRT/CTRS   Meagan Ancona L 08/17/2015 4:02 PM

## 2015-08-18 MED ORDER — SIMVASTATIN 40 MG PO TABS: ORAL_TABLET | ORAL | Status: AC

## 2015-08-18 MED ORDER — DIVALPROEX SODIUM ER 500 MG PO TB24: 1500.0000 mg | ORAL_TABLET | Freq: Every day | ORAL | Status: DC

## 2015-08-18 MED ORDER — TRAZODONE HCL 100 MG PO TABS: 100.0000 mg | ORAL_TABLET | Freq: Every evening | ORAL | Status: DC | PRN

## 2015-08-18 MED ORDER — OLANZAPINE 7.5 MG PO TABS: 7.5000 mg | ORAL_TABLET | Freq: Every day | ORAL | Status: DC

## 2015-08-18 MED ORDER — HYDROXYZINE HCL 50 MG PO TABS: 50.0000 mg | ORAL_TABLET | Freq: Four times a day (QID) | ORAL | Status: DC | PRN

## 2015-08-18 NOTE — Progress Notes (Signed)
**Note De-Identified Walski Obfuscation** D: Patient alert and oriented x 4. Patient denies pain/SI/HI/AVH. Patient reports he is feeling better except he is having ringing in his left ear which has been consent for 4 week.  A: Staff to monitor Q 15 mins for safety. Encouragement and support offered. Scheduled medications administered per orders. R: Patient remains safe on the unit. Patient attended group tonight. Patient visible on hte unit and interacting with peers. Patient taking administered medications.

## 2015-08-18 NOTE — [facility] Suicide Risk Assessment (Signed)
**Note De-Identified Triggs Obfuscation** [facility]   Demographic Factors:  Male and Caucasian  Total Time spent with patient: 30 minutes  Musculoskeletal: Strength & Muscle Tone: within normal limits Gait & Station: normal Patient leans: normal  Psychiatric Specialty Exam: Physical Exam  Review of Systems  Constitutional: Negative.   HENT: Negative.   Eyes: Negative.   Respiratory: Negative.   Cardiovascular: Negative.   Gastrointestinal: Negative.   Genitourinary: Negative.   Musculoskeletal: Negative.   Skin: Negative.   Neurological: Negative.   Endo/Heme/Allergies: Negative.   Psychiatric/Behavioral: Positive for substance abuse.    Blood pressure 117/79, pulse 74, temperature 98.4 F (36.9 C), temperature source Oral, resp. rate 20, height 5\' 11"  (1.803 m), weight 88.451 kg (195 lb), SpO2 99 %.Body mass index is 27.21 kg/(m^2).  General Appearance: Fairly Groomed  Patent attorneyye Contact::  Fair  Speech:  Clear and Coherent409  Volume:  Normal  Mood:  Euthymic  Affect:  Restricted  Thought Process:  Coherent and Goal Directed  Orientation:  Full (Time, Place, and Person)  Thought Content:  plans as he moves on, relapse prevention plan  Suicidal Thoughts:  No  Homicidal Thoughts:  No  Memory:  Immediate;   Fair Recent;   Fair Remote;   Fair  Judgement:  Fair  Insight:  Present  Psychomotor Activity:  Normal  Concentration:  Fair  Recall:  [employer]Fair  Fund of Knowledge:Fair  Language: Fair  Akathisia:  No  Handed:  Right  AIMS (if indicated):     Assets:  Desire for Improvement Housing Social Support  Sleep:  Number of Hours: 6.75  Cognition: WNL  ADL's:  Intact   Have you used any form of tobacco in the last 30 days? (Cigarettes, Smokeless Tobacco, Cigars, and/or Pipes): Yes  Has this patient used any form of tobacco in the last 30 days? (Cigarettes, Smokeless Tobacco, Cigars, and/or Pipes) Yes, A prescription for an FDA-approved tobacco cessation medication was offered at  discharge and the patient refused  Mental Status Per Nursing Assessment::   On Admission:     Current Mental Status by Physician: In full contact with reality. There are no active SI plans or intent. He is insightful in terms of his Bipolar Disorder and the effect his use of alcohol had on his mood. He states he is committed to abstinence and to continue treatment for his Bipolar Disorder. He did sleep well last night. He is looking forward to go home today   Loss Factors: NA  Historical Factors: NA  Risk Reduction Factors:   Sense of responsibility to family, Living with another person, especially a relative and Positive social support  Continued Clinical Symptoms:  Bipolar Disorder:   Mixed State Alcohol/Substance Abuse/Dependencies  Cognitive Features That Contribute To Risk:  None    Suicide Risk:  Minimal: No identifiable suicidal ideation.  Patients presenting with no risk factors but with morbid ruminations; may be classified as minimal risk based on the severity of the depressive symptoms  Principal Problem: Bipolar disorder, curr episode mixed, severe, w/o psychotic features [facility](HCC) Discharge Diagnoses:  Patient Active Problem List   Diagnosis Date Noted  . Bipolar disorder, curr episode mixed, severe, w/o psychotic features (HCC) [F31.63] 08/15/2015  . Alcohol use disorder, moderate, dependence (HCC) [F10.20] 08/15/2015  . Smoker [Z72.0] 08/26/2012  . Thyroid nodule [E04.1] 08/26/2012  . Hyperlipidemia [E78.5] 08/26/2012  . EtOH dependence (HCC) [F10.20] 08/26/2012    Follow-up Information    Follow up with [facility] On 08/25/2015.   Why: **Note De-Identified Orcutt Obfuscation** Thursday at 3;40 with Dr Lesly Dukes information:   [address]  [place]  [phone]] [phone]      Follow up with [facility] On 08/22/2015.   Why:  Monday at 1:30 with Eyvonne Mechanic   Contact information:   [address]      Plan Of Care/Follow-up recommendations:  Activity:   as tolerated Diet:  regular Follow up as above Is patient on multiple antipsychotic therapies at discharge:  No   Has Patient had three or more failed trials of antipsychotic monotherapy by history:  No  Recommended Plan for Multiple Antipsychotic Therapies: NA    Kalinda Romaniello A 08/18/2015, 11:47 AM

## 2015-08-18 NOTE — Discharge Summary (Signed)
**Note De-Identified Asberry Obfuscation** Physician Discharge Summary Note  Patient:  Jared Molina is an 54 y.o., male MRN:  [id number] DOB:  06-28-1961 Patient phone:  [phone] (home)  Patient address:   [address] [place] [address],  Total Time spent with patient: 45 minutes  Date of Admission:  08/13/2015 Date of Discharge: 08/18/2015  Reason for Admission:   History of Present Illness:: Patient is a 54 year old Married male, seen with NP and with RN. At present patient presents poorly related, irritable, refusing to answer most questions, providing minimal information at this time. His eye contact is poor, states " I don't trust you, I am done talking with you." As discussed with RN / staff, his presentation has been variable, at times more cooperative , interactive and at others angry, hostile, for example closing door on staff . Patient did provide verbal consent to speak with his wife and she provided significant collateral information. Patient Has a history of Bipolar Disorder. As per report he also has a history of alcohol abuse , but had stopped drinking In September and wife does not feel he has relapsed . ( BAL was negative upon admission). He was presenting with increased anxiety, psychomotor restlessness and poor sleep. These symptoms worsened and were more noticeable over the week prior to admission. Wife emphasizes he seemed anxious, fearful and had severe insomnia. Patient has been on Depakote ER for several years, and had also been on Risperidone, but it had recently been changed to Seroquel. He presented to the ER complaining of racing thoughts, inability to function , insomnia.   Principal Problem: Bipolar disorder, curr episode mixed, severe, w/o psychotic features [facility]) Discharge Diagnoses: Patient Active Problem List   Diagnosis Date Noted  . Bipolar disorder, curr episode mixed, severe, w/o psychotic features (HCC) [F31.63] 08/15/2015  . Alcohol use disorder, moderate, dependence (HCC)  [F10.20] 08/15/2015  . Smoker [Z72.0] 08/26/2012  . Thyroid nodule [E04.1] 08/26/2012  . Hyperlipidemia [E78.5] 08/26/2012  . EtOH dependence (HCC) [F10.20] 08/26/2012    Past Psychiatric History: As per wife, chart, patient had one prior admission in 2006, for similar presentation. No known history of suicide attempts, no known prior history of psychosis. As noted, recent psychiatric medication changes- he follows up at [facility] for outpatient medication management .  Past Medical History:  Past Medical History  Diagnosis Date  . Depression   . Bipolar 1 disorder (HCC)    History reviewed. No pertinent past surgical history. Family History:  Family History  Problem Relation Age of Onset  . Heart disease Father   . Hyperlipidemia Father   . Heart disease Maternal Grandfather   . Hyperlipidemia Maternal Grandfather   . Cancer Paternal Grandmother     lung cancer   Family Psychiatric  History: Father has dementia, mother doing well, one sister may have history of mental illness . No known suicides in family Social History:  History  Alcohol Use No    Comment: quit drinking in Sept 2016     History  Drug Use No    Social History   Social History  . Marital Status: Married    Spouse Name: N/A  . Number of Children: N/A  . Years of Education: N/A   Social History Main Topics  . Smoking status: Former Smoker    Quit date: 11/24/2013  . Smokeless tobacco: None  . Alcohol Use: No     Comment: quit drinking in Sept 2016  . Drug Use: No  . Sexual Activity: **Note De-Identified Wubben Obfuscation** Yes   Other Topics Concern  . None   Social History Narrative   Married.    Hospital Course:   Jared Molina was admitted for Bipolar disorder, curr episode mixed, severe, w/o psychotic features (HCC) , with psychosis and crisis management.  Pt was treated discharged with the medications listed below under Medication List.  Medical problems were identified and treated as needed.  Home medications were  restarted as appropriate.  Improvement was monitored by observation and Jared Molina 's daily report of symptom reduction.  Emotional and mental status was monitored by daily self-inventory reports completed by Jared Molina and clinical staff.         Jared Molina was evaluated by the treatment team for stability and plans for continued recovery upon discharge. Jared Molina 's motivation was an integral factor for scheduling further treatment. Employment, transportation, bed availability, health status, family support, and any pending legal issues were also considered during hospital stay. Pt was offered further treatment options upon discharge including but not limited to Residential, Intensive Outpatient, and Outpatient treatment.  Jared Molina will follow up with the services as listed below under Follow Up Information.     Upon completion of this admission the patient was both mentally and medically stable for discharge denying suicidal/homicidal ideation, auditory/visual/tactile hallucinations, delusional thoughts and paranoia.    Family session went well. No seclusion or restraint.  Jared Molina responded well to treatment with vistaril, Zyprexa, Trazodone, and Depakote without adverse effects. Pt demonstrated improvement without reported or observed adverse effects to the point of stability appropriate for outpatient management. Pertinent labs include: A1C 5.7 (high), and Valproic acid of 85 (therapeutic). Reviewed CBC, CMP, BAL, and UDS; all unremarkable aside from noted exceptions.   Physical Findings: AIMS: Facial and Oral Movements Muscles of Facial Expression: None, normal Lips and Perioral Area: None, normal Jaw: None, normal Tongue: None, normal,Extremity Movements Upper (arms, wrists, hands, fingers): None, normal Lower (legs, knees, ankles, toes): None, normal, Trunk Movements Neck, shoulders, hips: None, normal, Overall Severity Severity of abnormal movements (highest score from questions above):  None, normal Incapacitation due to abnormal movements: None, normal Patient's awareness of abnormal movements (rate only patient's report): No Awareness, Dental Status Current problems with teeth and/or dentures?: No Does patient usually wear dentures?: No  CIWA:    COWS:     Musculoskeletal: Strength & Muscle Tone: within normal limits Gait & Station: normal Patient leans: N/A  Psychiatric Specialty Exam: Review of Systems  Psychiatric/Behavioral: Positive for depression. Negative for suicidal ideas, hallucinations and substance abuse. The patient is nervous/anxious and has insomnia.   All other systems reviewed and are negative.   Blood pressure 117/79, pulse 74, temperature 98.4 F (36.9 C), temperature source Oral, resp. rate 20, height  (1.803 m), weight 88.451 kg (195 lb), SpO2 99 %.Body mass index is 27.21 kg/(m^2).  SEE MD PSE within the SRA   Have you used any form of tobacco in the last 30 days? (Cigarettes, Smokeless Tobacco, Cigars, and/or Pipes): Yes  Has this patient used any form of tobacco in the last 30 days? (Cigarettes, Smokeless Tobacco, Cigars, and/or Pipes) Yes, No  Metabolic Disorder Labs:  Lab Results  Component Value Date   HGBA1C 5.7* 08/16/2015   MPG 117 08/16/2015   MPG 114 08/15/2015   Lab Results  Component Value Date   PROLACTIN 6.7 08/16/2015   Lab Results  Component Value Date   CHOL 119 08/16/2015   TRIG 108 08/16/2015   HDL 38* **Note De-Identified Gallacher Obfuscation** 08/16/2015   CHOLHDL 3.1 08/16/2015   VLDL 22 08/16/2015   LDLCALC 59 08/16/2015   LDLCALC 64 04/05/2015    See Psychiatric Specialty Exam and Suicide Risk Assessment completed by Attending Physician prior to discharge.  Discharge destination:  Home  Is patient on multiple antipsychotic therapies at discharge:  No   Has Patient had three or more failed trials of antipsychotic monotherapy by history:  No  Recommended Plan for Multiple Antipsychotic Therapies: NA     Medication List    TAKE  these medications      Indication   divalproex 500 MG 24 hr tablet  Commonly known as:  DEPAKOTE ER  Take 3 tablets (1,500 mg total) by mouth at bedtime.   Indication:  mood stabilization     hydrOXYzine 50 MG tablet  Commonly known as:  ATARAX/VISTARIL  Take 1 tablet (50 mg total) by mouth every 6 (six) hours as needed for anxiety.   Indication:  Anxiety Neurosis     OLANZapine 7.5 MG tablet  Commonly known as:  ZYPREXA  Take 1 tablet (7.5 mg total) by mouth at bedtime.   Indication:  psychosis     simvastatin 40 MG tablet  Commonly known as:  ZOCOR  TAKE 1 TABLET (40 MG TOTAL) BY MOUTH DAILY.   Indication:  dyslipidemia     traZODone 100 MG tablet  Commonly known as:  DESYREL  Take 1 tablet (100 mg total) by mouth at bedtime as needed for sleep.   Indication:  Trouble Sleeping           Follow-up Information    Follow up with [facility] On 08/25/2015.   Why:  Thursday at 3;40 with Dr Lesly DukesJennings   Contact information:   [address]  [place]  [phone]      Follow up with [facility] On 08/22/2015.   Why:  Monday at 1:30 with Eyvonne MechanicLarry Willett   Contact information:   [address]     Follow-up recommendations:  Activity:  As tolerated Diet:  Heart healthy with low sodium.  Comments:    Take all medications as prescribed. Keep all follow-up appointments as scheduled.  Do not consume alcohol or use illegal drugs while on prescription medications. Report any adverse effects from your medications to your primary care provider promptly.  In the event of recurrent symptoms or worsening symptoms, call 911, a crisis hotline, or go to the nearest emergency department for evaluation.   Signed: Beau Molina, John C, FNP-BC 08/18/2015, 10:17 AM  I agree with assessment and plan Madie Renorving A. Dub MikesLugo, M.D.

## 2015-08-18 NOTE — Progress Notes (Signed)
**Note De-Identified Ternes Obfuscation** Order received for patients discharge. Discharge instructions reviewed with patient at length. He denies any SI/HI/A/V Hallucinations. NO signs of acute decompensation. Pt educated on discharge care and follow up plan. He verbalized understanding of follow up instructions. AVS copy signed and given to patient. Crisis services reviewed. All belongings returned to the patient. He was given RX for all medications and escorted from the unit to the lobby to the care of his wife.

## 2015-10-09 ENCOUNTER — Telehealth: Payer: Self-pay | Admitting: Family Medicine

## 2015-10-09 NOTE — Telephone Encounter (Signed)
**Note De-identified Usery Obfuscation** lmom to call and reschedule appt with daub 

## 2015-10-09 NOTE — Telephone Encounter (Signed)
**Note De-identified Topp Obfuscation** lmom to call and reschedule appt with daub 

## 2016-02-19 IMAGING — US US SOFT TISSUE HEAD/NECK
1 series · 14 of 25 positions shown · non-contrast
Comparison: Ultrasound of the thyroid of 12/24/2011

CLINICAL DATA: Followup thyroid nodules

EXAM:
THYROID ULTRASOUND
TECHNIQUE: Ultrasound examination of the thyroid gland and adjacent soft
tissues was performed.

[Series 1: us soft tissue head/neck · 0.04mm/px · 14 of 55 slices shown]
[im 1/55]
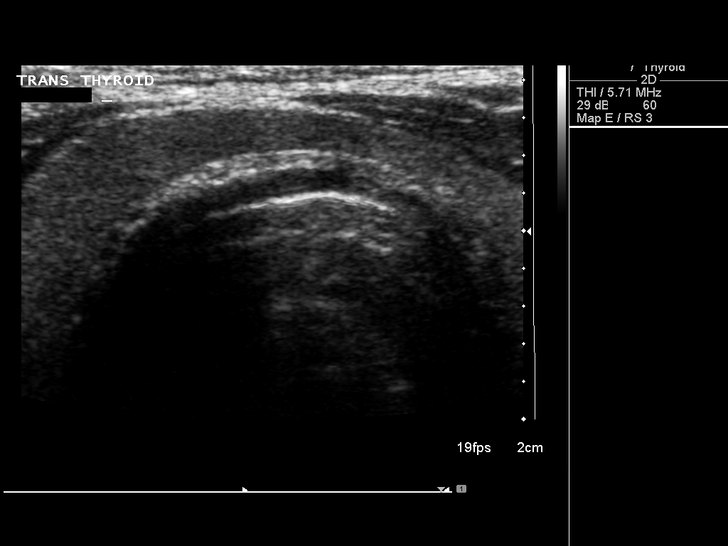
[im 5/55]
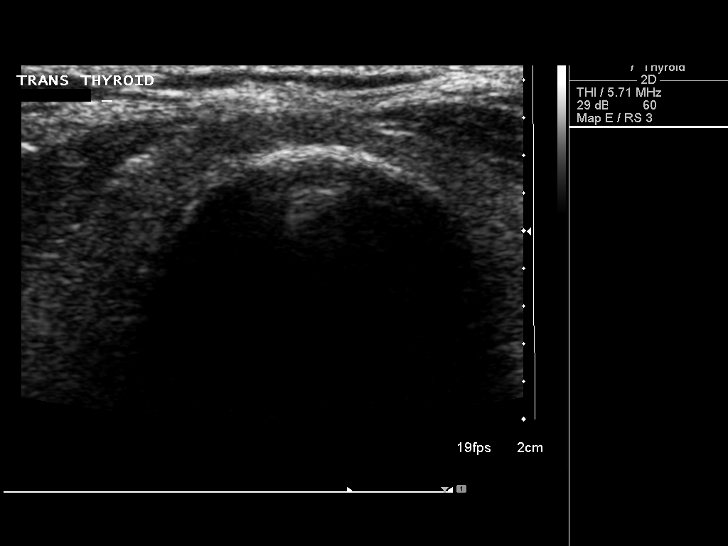
[im 10/55]
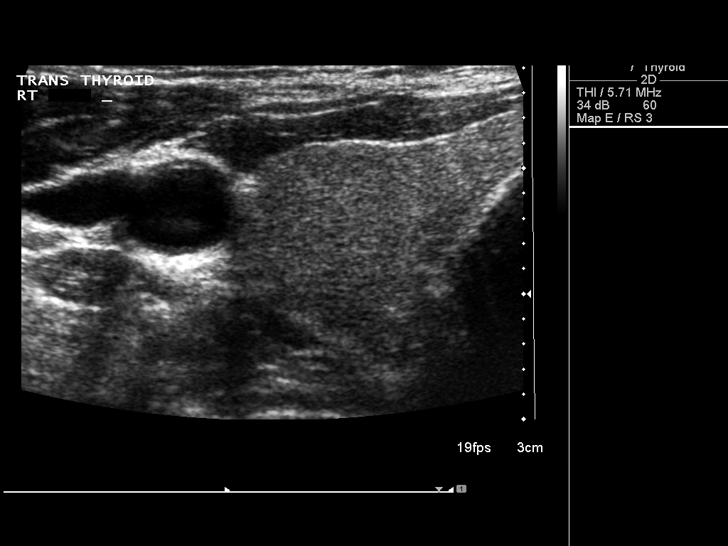
[im 14/55]
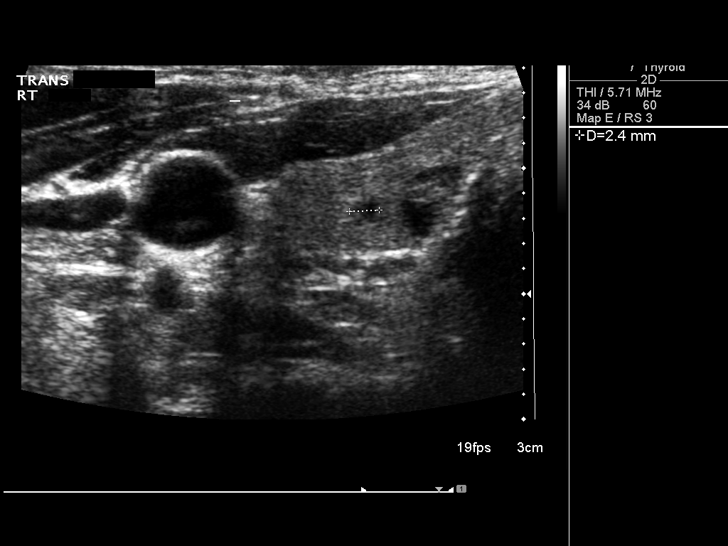
[im 19/55]
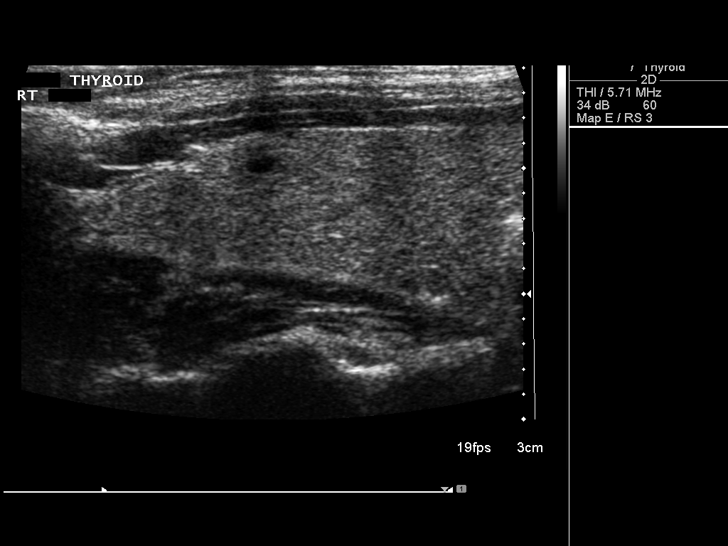
[im 21/55]
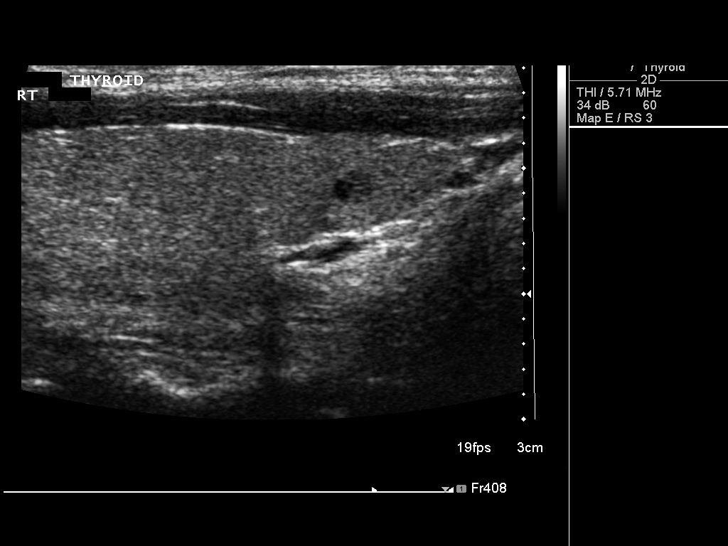
[im 25/55]
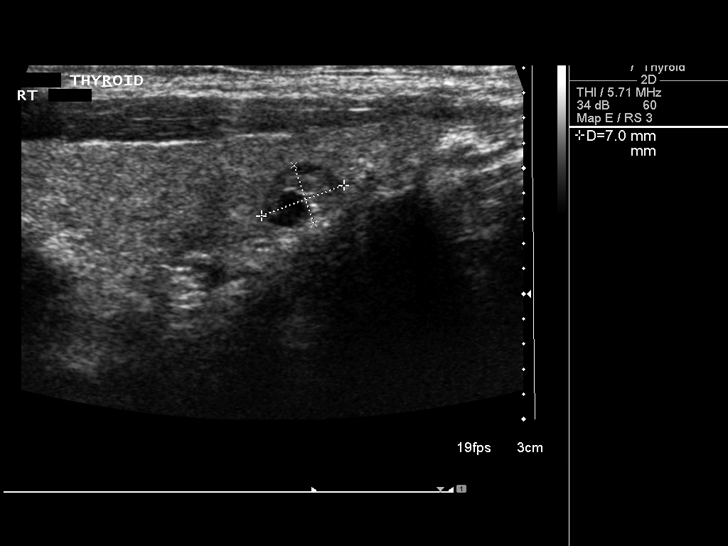
[im 30/55]
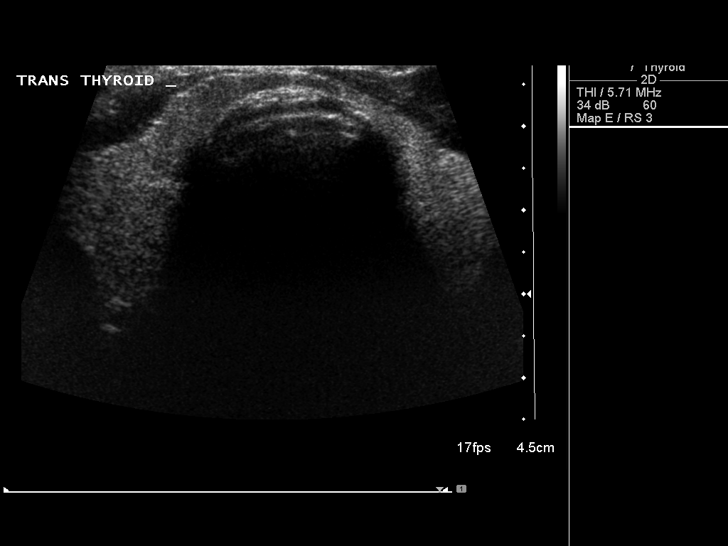
[im 34/55]
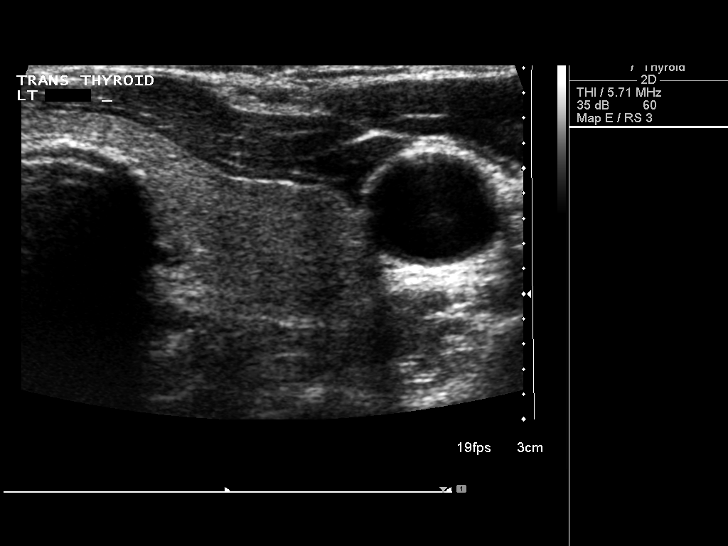
[im 37/55]
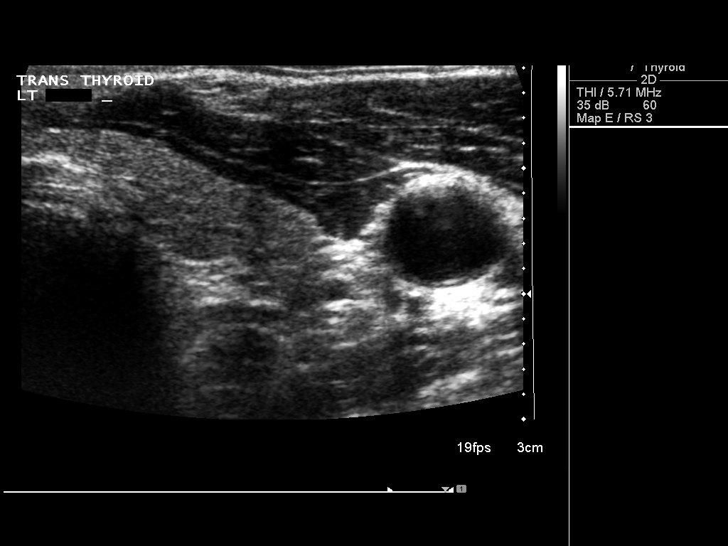
[im 41/55]
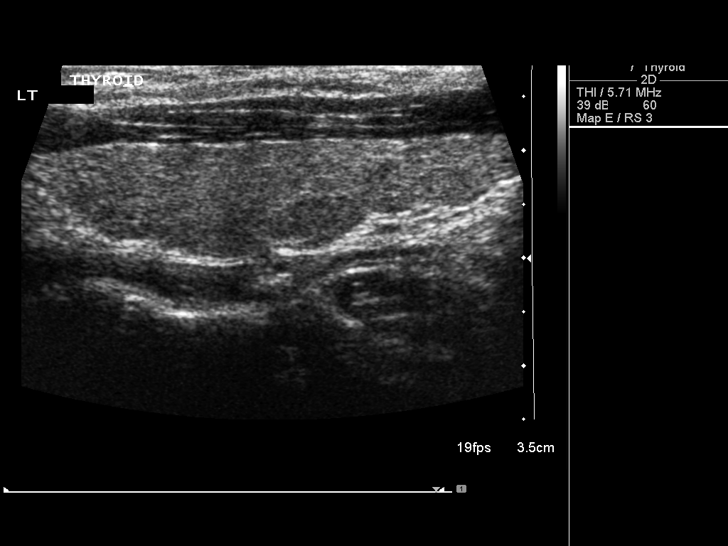
[im 46/55]
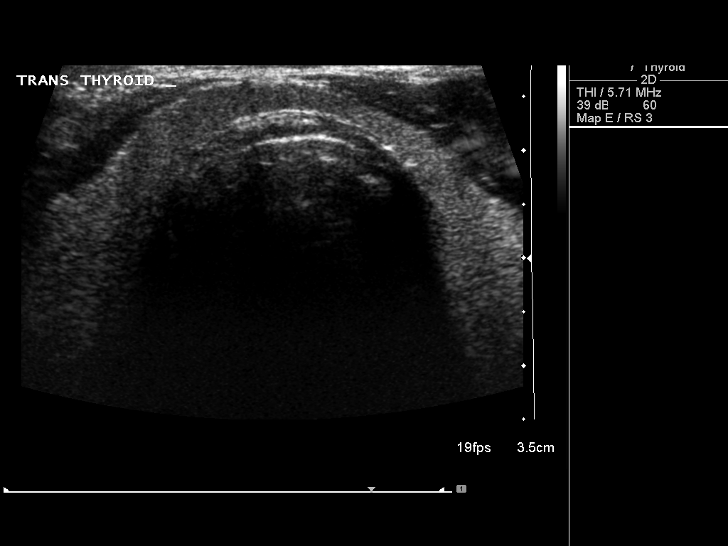
[im 50/55]
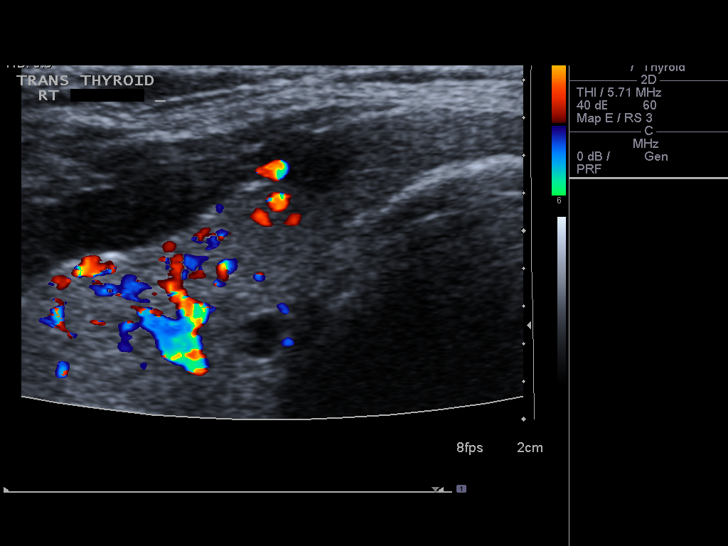
[im 55/55]
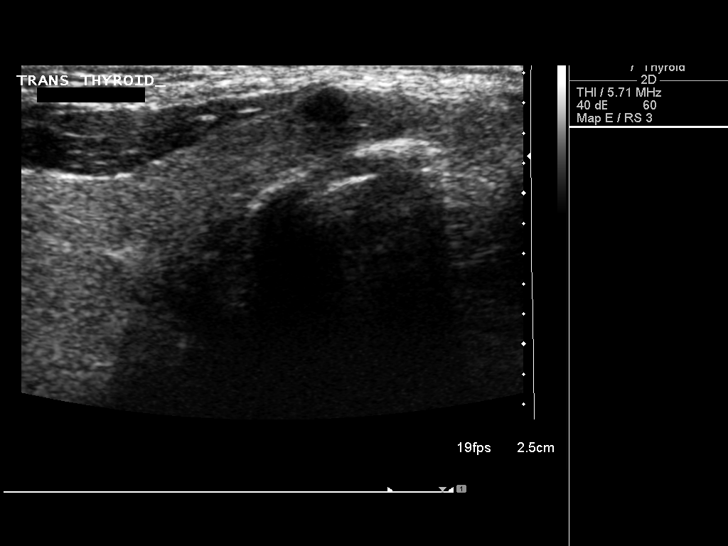

[14 of 25 positions shown; findings below may reference images not displayed]

FINDINGS: Right thyroid lobe

Measurements: 5.0 x 1.5 x 1.8 cm. (Previously 5.4 x 1.7 x 0.7 cm)..
Small mixed echogenic nodules remain throughout the right lobe with
the dominant nodule measuring no more than 7 mm medially in the
right lower lobe. The previously noted 1.5 cm nodule is no longer
seen. This previous dominant nodule was biopsied on 12/25/2011.

Left thyroid lobe

Measurements: 4.6 x 1.0 x 1.7 cm. (Previously 5.0 x 1.1 x 1.7 cm)..
No left thyroid nodules are seen.

Isthmus

Thickness: 2.6 mm in thickness..  No nodules are noted.

Lymphadenopathy

None visualized.
IMPRESSION: The dominant nodule noted previously is no longer seen. The largest
nodule on the right measures 7 mm in diameter.

## 2016-03-18 IMAGING — CR DG CHEST 2V
2 series · 2 of 2 positions shown · non-contrast
Comparison: 08/26/2012

CLINICAL DATA: Weight loss, smoker

EXAM:
CHEST  2 VIEW

[PA]
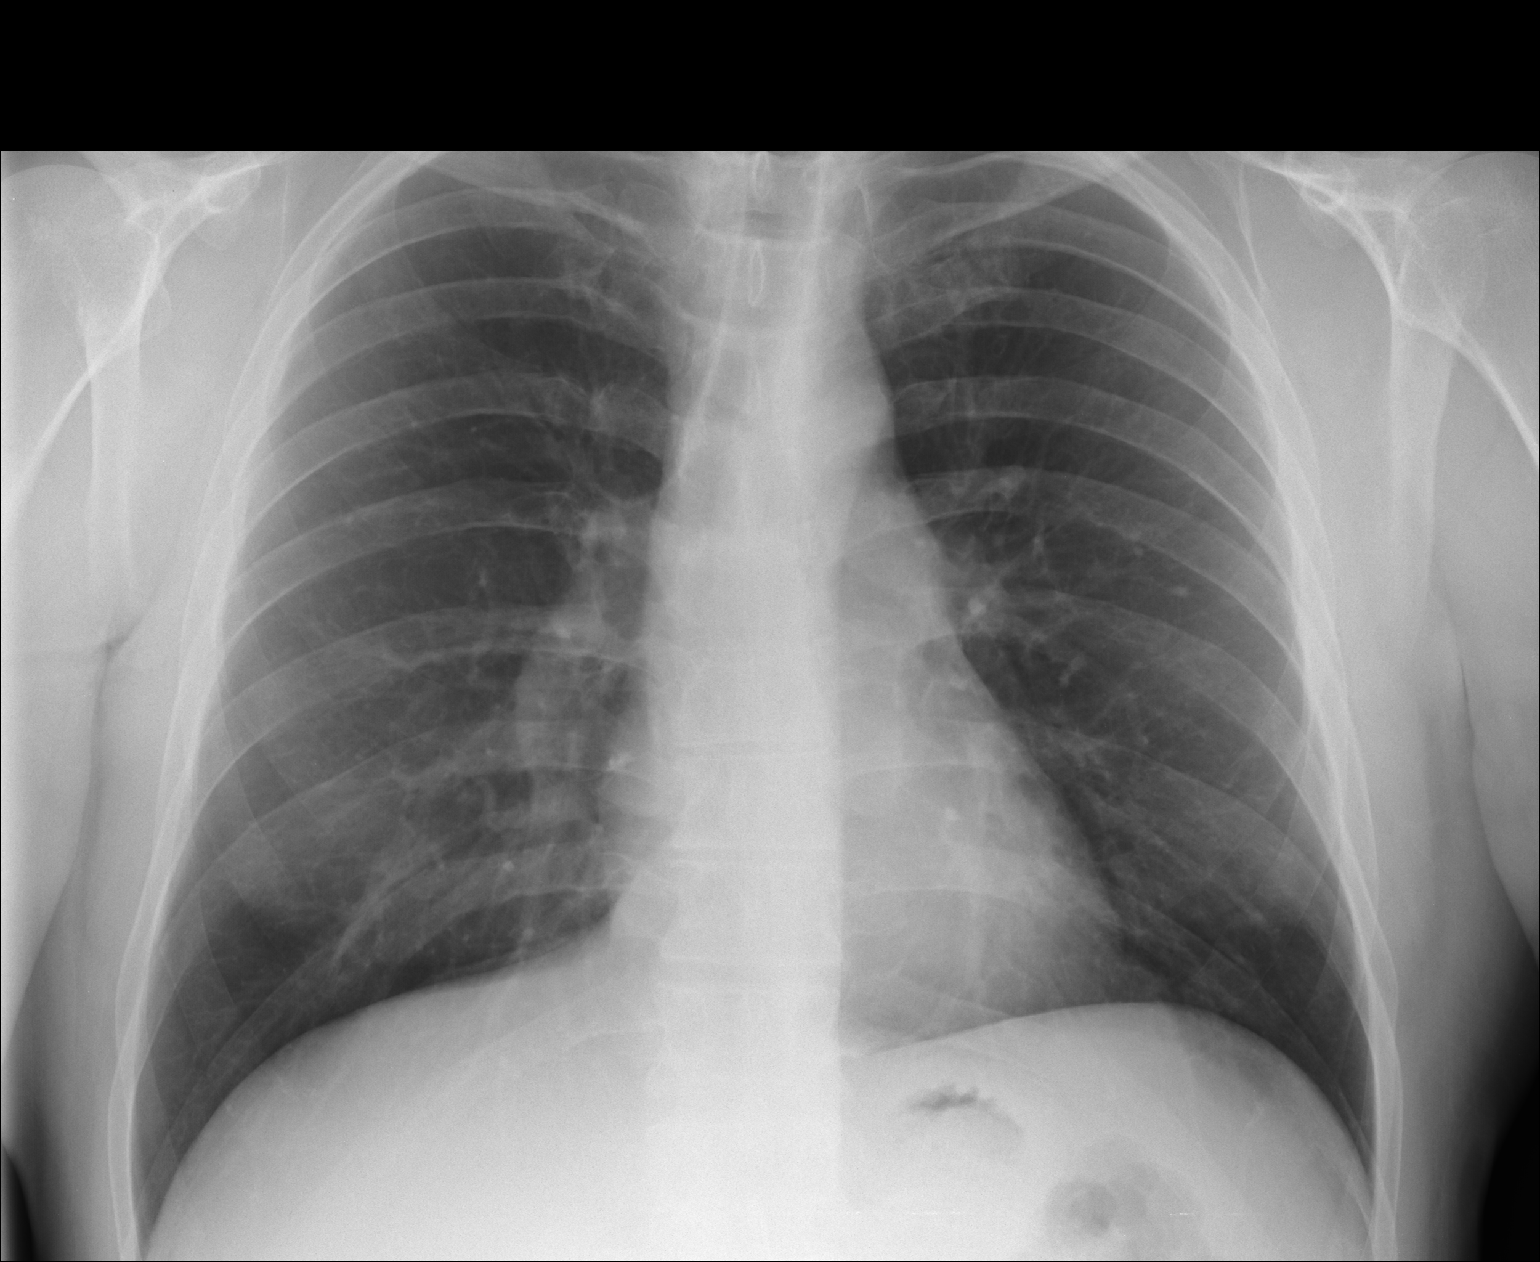

[lateral]
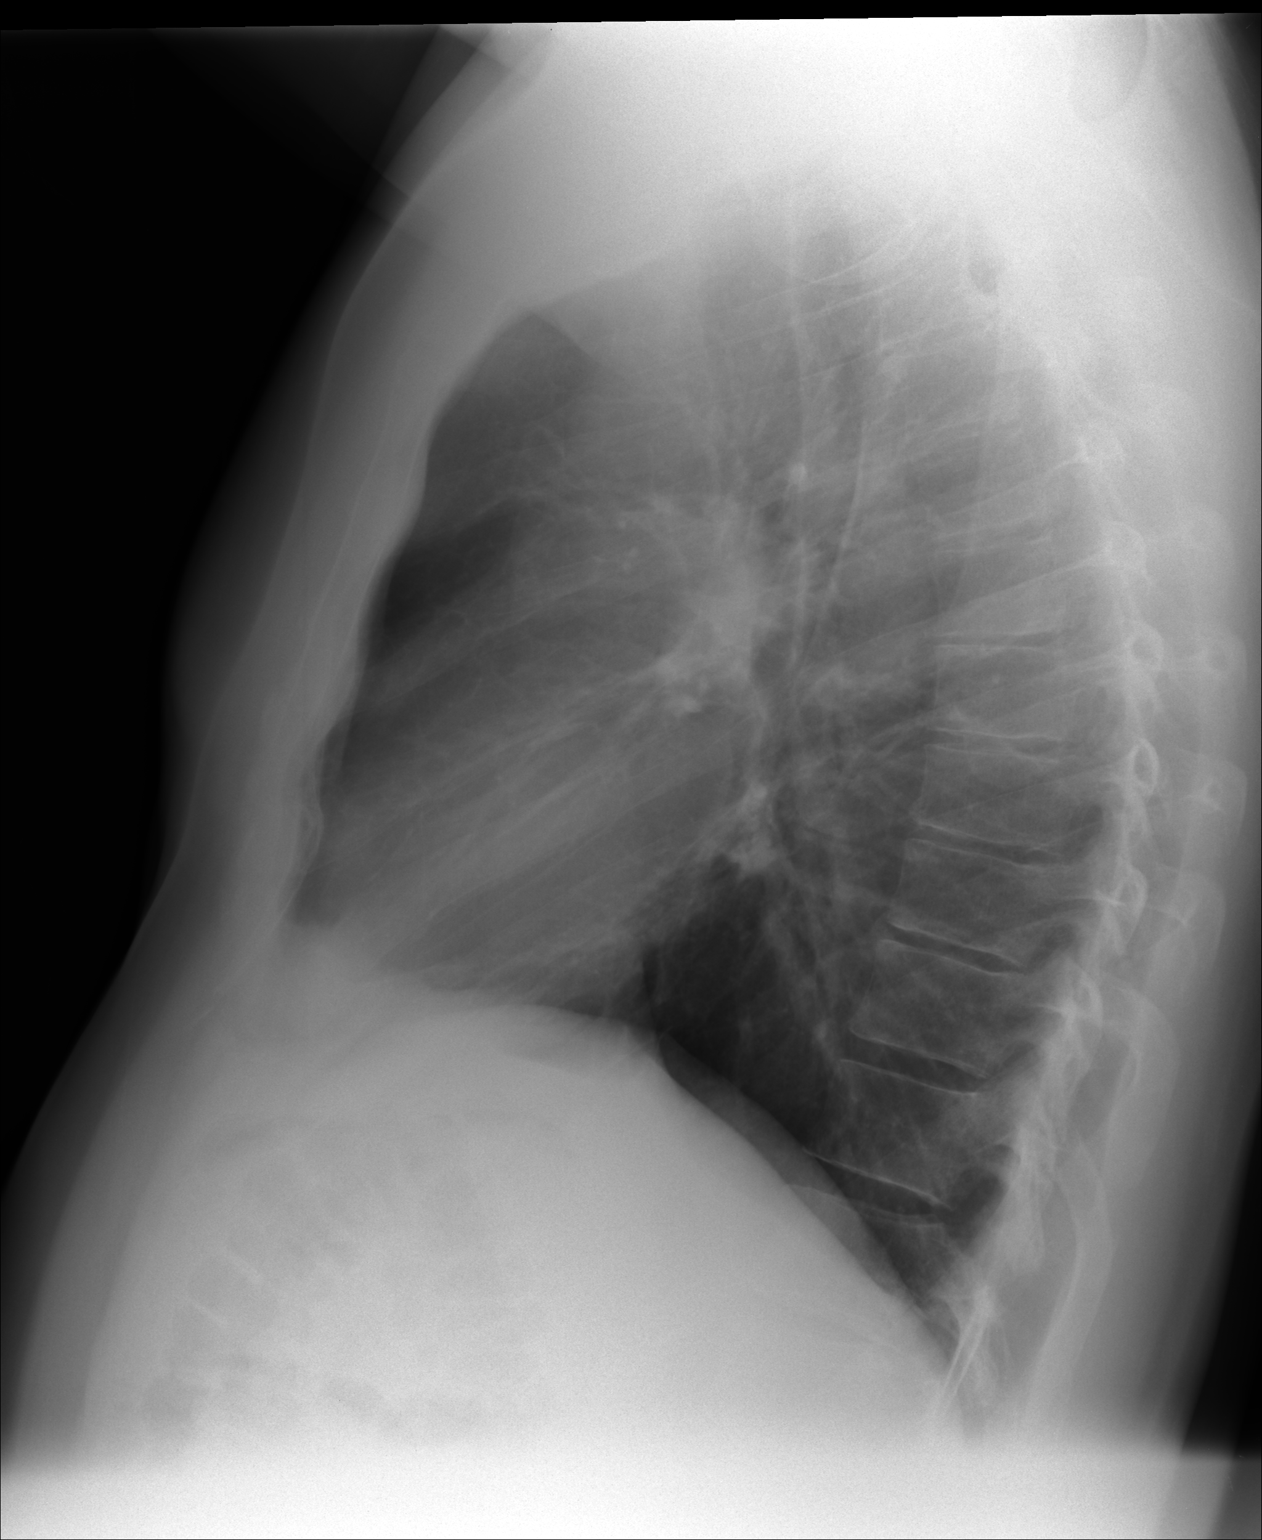

[2 of 2 positions shown; findings below may reference images not displayed]

FINDINGS: Normal heart size, mediastinal contours, and pulmonary vascularity.

Minimal subsegmental atelectasis at right base.

Lungs otherwise clear.

No pulmonary infiltrate, pleural effusion or pneumothorax.

Bones unremarkable.
IMPRESSION: Minimal right basilar subsegmental atelectasis without acute
infiltrate.

Per request: No radiographic follow-up required unless clinically
indicated.

## 2016-04-10 ENCOUNTER — Encounter: Payer: 59 | Admitting: Emergency Medicine

## 2016-04-11 ENCOUNTER — Other Ambulatory Visit: Payer: Self-pay | Admitting: Emergency Medicine

## 2016-04-12 ENCOUNTER — Encounter: Payer: No Typology Code available for payment source | Admitting: Emergency Medicine

## 2018-07-13 ENCOUNTER — Encounter: Payer: Self-pay | Admitting: Emergency Medicine

## 2018-07-13 DIAGNOSIS — F1020 Alcohol dependence, uncomplicated: Secondary | ICD-10-CM

## 2018-07-13 DIAGNOSIS — F3176 Bipolar disorder, in full remission, most recent episode depressed: Secondary | ICD-10-CM | POA: Insufficient documentation

## 2018-07-13 DIAGNOSIS — F4312 Post-traumatic stress disorder, chronic: Secondary | ICD-10-CM

## 2018-07-22 ENCOUNTER — Ambulatory Visit: Payer: No Typology Code available for payment source | Admitting: Psychiatry

## 2018-08-11 ENCOUNTER — Encounter: Payer: Self-pay | Admitting: Psychiatry

## 2018-08-11 ENCOUNTER — Ambulatory Visit (INDEPENDENT_AMBULATORY_CARE_PROVIDER_SITE_OTHER): Payer: 59 | Admitting: Psychiatry

## 2018-08-11 VITALS — BP 110/72 | HR 72 | Ht 72.0 in | Wt 199.0 lb

## 2018-08-11 DIAGNOSIS — F3176 Bipolar disorder, in full remission, most recent episode depressed: Secondary | ICD-10-CM

## 2018-08-11 DIAGNOSIS — F1021 Alcohol dependence, in remission: Secondary | ICD-10-CM | POA: Diagnosis not present

## 2018-08-11 DIAGNOSIS — F4312 Post-traumatic stress disorder, chronic: Secondary | ICD-10-CM | POA: Diagnosis not present

## 2018-08-11 MED ORDER — OLANZAPINE 2.5 MG PO TABS: 2.5000 mg | ORAL_TABLET | Freq: Every day | ORAL | 3 refills | Status: DC

## 2018-08-11 MED ORDER — DIVALPROEX SODIUM ER 500 MG PO TB24: 1000.0000 mg | ORAL_TABLET | Freq: Every day | ORAL | 3 refills | Status: DC

## 2018-08-11 MED ORDER — CLONAZEPAM 0.5 MG PO TABS: 0.5000 mg | ORAL_TABLET | Freq: Two times a day (BID) | ORAL | 0 refills | Status: DC | PRN

## 2018-08-11 NOTE — Progress Notes (Signed)
**Note De-Identified Jeffus Obfuscation** [facility]  Patient ID: Jared Molina,  MRN: [id number]  PCP: Eartha Inch, MD  Date of Evaluation: 08/11/2018 Time spent:20 minutes  Chief Complaint:  Chief Complaint    Depression; Anxiety      HISTORY/CURRENT STATUS: Jared Molina is seen individually face-to-face with consent not collateral for psychiatric interview and exam in 45-month evaluation and management of bipolar depression with mixed features, PTSD, and sustained remission of alcohol use.  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  Though he is gained 2 pounds in the interim, he notes [employer] workouts and careful diet as Dr. Cyndia Bent encourages including to continue all of his psychiatric medication as the cost and consequence of decompensation again is of greater risk than any other medical concern.  He continues his simvastatin monitoring cholesterol regularly including to have upcoming physical soon after that last year showed only a mild elevation of one type of cholesterol.  He has 3 years now since his last inpatient stay for decompensation, and we review his preparation and worry for after Thanksgiving when he decompensated the last 2 times.  He notes in the interim learning of maternal family history of alcohol and tobacco addiction.  He has not used any clonazepam since prescription received last January, having current supply he considers expired now but agrees that having it on hand for this most delicate next month is useful and preventive. He continues AA episodically.  Depression       The patient presents with depression.  This is a recurrent problem.  The current episode started more than 1 year ago.   The onset quality is sudden.   The problem occurs intermittently.  The most recent episode lasted 3 weeks.    The problem has been gradually  improving since onset.  Associated symptoms include no decreased concentration, no helplessness, no hopelessness, does not have insomnia, no restlessness, no decreased interest, no appetite change, not sad and no suicidal ideas.     The symptoms are aggravated by social issues, work stress and family issues.  Past treatments include other medications and psychotherapy.  Compliance with treatment is good.  Past compliance problems include medical issues and medication issues.  Previous treatment provided significant relief.  Risk factors include a change in medication usage/dosage, family history of mental illness, family history, history of mental illness, major life event, prior psychiatric admission, prior traumatic experience, stress and substance abuse.   Past medical history includes anxiety, bipolar disorder, depression, mental health disorder and post-traumatic stress disorder.     Pertinent negatives include no hypothyroidism, no thyroid problem, no chronic illness, no recent illness, no life-threatening condition, no Alzheimer's disease, no brain trauma, no dementia, no eating disorder, no obsessive-compulsive disorder, no schizophrenia, no suicide attempts and no head trauma. Anxiety  Symptoms include compulsions, excessive worry, nervous/anxious behavior and obsessions. Patient reports no chest pain, confusion, decreased concentration, depressed mood, dizziness, dry mouth, feeling of choking, hyperventilation, insomnia, irritability, nausea, palpitations, panic, restlessness, shortness of breath or suicidal ideas. Symptoms occur occasionally. The most recent episode lasted 30 minutes. The severity of symptoms is moderate. The patient sleeps 1 hour per night. The quality of sleep is fair. Nighttime awakenings: occasional.   His past medical history is significant for depression. There is no history of suicide attempts. Compliance with medications is 76-100%. **Note De-Identified Hofferber Obfuscation** Individual Medical History/ Review of  Systems: Changes? :Yes .  Hyperlipidemia is monitored closely having previous right inner ear reconstruction, tinnitus, and a single seizure otherwise 3 systems negative.  Hyperlipidemia has been up for 1 cholesterol component having simvastatin for treatment which he takes somewhat irregularly.  He and Dr. Cyndia Bent agree that the low-dose olanzapine is psychiatrically essential and less medically worrisome.  Allergies: Patient has no known allergies.  Current Medications:  Current Outpatient Medications:  .  divalproex (DEPAKOTE ER) 500 MG 24 hr tablet, Take 2 tablets (1,000 mg total) by mouth at bedtime., Disp: 60 tablet, Rfl: 3 .  clonazePAM (KLONOPIN) 0.5 MG tablet, Take 1 tablet (0.5 mg total) by mouth 2 (two) times daily as needed for anxiety., Disp: 30 tablet, Rfl: 0 .  OLANZapine (ZYPREXA) 2.5 MG tablet, Take 1 tablet (2.5 mg total) by mouth at bedtime., Disp: 30 tablet, Rfl: 3 .  simvastatin (ZOCOR) 40 MG tablet, TAKE 1 TABLET (40 MG TOTAL) BY MOUTH DAILY., Disp: 30 tablet, Rfl: 11 Medication Side Effects: none  Family Medical/ Social History: Changes? Yes patient is now aware that addiction is prominent in the maternal side of the family particularly for alcohol and tobacco.  He is more secure and comfortable in his job than ever with wife particularly reminding him he has a good job.  Younger sister is on Prozac and older sister is a sexual assault victim.  MENTAL HEALTH EXAM: Muscle strengths 5/5, postural reflexes 0/0, and AIMS equals 0. Blood pressure 110/72, pulse 72, height 6' (1.829 m), weight 199 lb (90.3 kg).Body mass index is 26.99 kg/m.  General Appearance: Casual, Fairly Groomed and Guarded  Eye Contact:  Good  Speech:  Clear and Coherent and Normal Rate  Volume:  Normal  Mood:  Anxious, Dysphoric and Euthymic  Affect:  Labile and Anxious  Thought Process:  Goal Directed  Orientation:  Full (Time, Place, and Person)  Thought Content: Rumination   Suicidal Thoughts:  No   Homicidal Thoughts:  No  Memory:  Remote;   Good  Judgement:  Good  Insight:  Fair  Psychomotor Activity:  Normal  Concentration:  Concentration: Fair and Attention Span: Good  Recall:  Good  Fund of Knowledge: Good  Language: Fair  Assets:  Desire for Improvement Housing Leisure Time  ADL's:  Intact  Cognition: WNL  Prognosis:  Good    DIAGNOSES:    ICD-10-CM   1. Bipolar I disorder, moderate, current or most recent episode depressed, in full remission, with mixed features (HCC) F31.76 divalproex (DEPAKOTE ER) 500 MG 24 hr tablet    OLANZapine (ZYPREXA) 2.5 MG tablet  2. Chronic post-traumatic stress disorder F43.12 divalproex (DEPAKOTE ER) 500 MG 24 hr tablet    OLANZapine (ZYPREXA) 2.5 MG tablet    clonazePAM (KLONOPIN) 0.5 MG tablet  3. Alcohol use disorder, moderate, in sustained remission (HCC) F10.21     Receiving Psychotherapy: Yes    RECOMMENDATIONS: We update secure patterns of recovery from previous 2 years for applying should this be a difficult year and mood, cognition, or physiologic anxiety at the holiday time.  His sobriety is secure in his ongoing AA and relationship with wife.  He continues olanzapine 2.5 mg nightly and Depakote 500 mg ER as 2 nightly for PTSD and bipolar disorder as a month supply and 3 refills each.  He is prescribed clonazepam 0.5 mg up to twice daily if needed for anxiety and insomnia #30 with no refill if needed for any decompensation in **Note De-Identified Kotch Obfuscation** the next month.  He returns in 4 months or sooner if worse understanding warnings and risk of diagnoses and treatment including medication for prevention and monitoring and safety hygiene.   Chauncey MannGlenn E Willis Kuipers, MD

## 2018-11-24 ENCOUNTER — Ambulatory Visit: Payer: 59 | Admitting: Psychiatry

## 2018-11-24 ENCOUNTER — Encounter: Payer: Self-pay | Admitting: Psychiatry

## 2018-11-24 VITALS — BP 118/74 | HR 70 | Ht 72.0 in | Wt 190.0 lb

## 2018-11-24 DIAGNOSIS — F3176 Bipolar disorder, in full remission, most recent episode depressed: Secondary | ICD-10-CM | POA: Diagnosis not present

## 2018-11-24 DIAGNOSIS — F4312 Post-traumatic stress disorder, chronic: Secondary | ICD-10-CM | POA: Diagnosis not present

## 2018-11-24 NOTE — Progress Notes (Signed)
**Note De-Identified [place] Obfuscation** [facility]  Patient ID: Jared Molina,  MRN: [id number]  PCP: Eartha Inch, MD  Date of Evaluation: 11/24/2018 Time spent:20 minutes  Chief Complaint:  Chief Complaint    Depression; Anxiety; Manic Behavior      HISTORY/CURRENT STATUS: Jared Molina is seen individually face-to-face with consent without collateral for psychiatric interview and exam month evaluation and management of bipolar, PTSD, and alcohol use disorder.  Jared Molina is slightly more conflictual regarding treatment needs today but self resolves with analysis of past and present decision making regarding continuing his Zyprexa and Depakote.  Jared Molina required no Klonopin through the holiday months at the end of last fall having no relapse in bipolar-PTSD.  Current job is now in place for 4 years.  Jared Molina still attends AA meetings episodically.  Jared Molina has no overt mania, psychosis, or panic substance use delirium.  Depression       The patient presents with depression.  This is a recurrent problem.  The current episode started more than 1 month ago.   The onset quality is sudden.   The problem occurs intermittently.  The problem has been gradually improving since onset.  Associated symptoms include irritable and decreased interest.  Associated symptoms include no decreased concentration, no fatigue, no helplessness, no hopelessness, does not have insomnia, no restlessness, no appetite change, no body aches, no myalgias, no headaches, no indigestion, not sad and no suicidal ideas.     The symptoms are aggravated by work stress, social issues and family issues.  Past treatments include other medications and psychotherapy.  Compliance with treatment is variable.  Past compliance problems include difficulty with treatment plan, medication issues and medical issues.  Previous treatment provided significant relief.  Risk factors include family history of mental illness, family history, history of mental illness, history of suicide attempt, major life  event, prior psychiatric admission, prior traumatic experience, stress and substance abuse.   Past medical history includes anxiety, bipolar disorder, depression, mental health disorder and post-traumatic stress disorder.     Pertinent negatives include no chronic illness, no life-threatening condition, no recent psychiatric admission, no eating disorder, no obsessive-compulsive disorder, no schizophrenia, no suicide attempts and no head trauma.   Individual Medical History/ Review of Systems: Changes? Jared Molina discusses most with PCP Dr. Cyndia Bent his risk and benefits of olanzapine relative to lipids and metabolic health concluding to continue medications long-term, stating today that Jared Molina had intervals in the past without his Depakote and Zyprexa for up to 5 years.  LIPID PROFILE Component Name 07/14/2018 07/09/2017 06/21/2016   150 123 140  89 185 (H) 228 (H)  43 32 (L) 34 (L)  18 37 46 (H)  89 54 60   1.7   Cholesterol, Total  Triglycerides  HDL  VLDL Cholesterol Cal  LDL  LDL/HDL  Component Name 07/14/2018 07/09/2017 01/24/2017 06/21/2016   86 99 97 98  Glucose   THYROID Component Name 07/14/2018 07/09/2017 01/24/2017 06/21/2016   2.650 3.220 2.590 2.950  TSH   Allergies: Patient has no known allergies.  Current Medications:  Current Outpatient Medications:  .  clonazePAM (KLONOPIN) 0.5 MG tablet, Take 1 tablet (0.5 mg total) by mouth 2 (two) times daily as needed for anxiety., Disp: 30 tablet, Rfl: 0 .  divalproex (DEPAKOTE ER) 500 MG 24 hr tablet, Take 2 tablets (1,000 mg total) by mouth at bedtime., Disp: 60 tablet, Rfl: 3 .  OLANZapine (ZYPREXA) 2.5 MG tablet, Take 1 tablet (2.5 mg total) by mouth at bedtime., Disp: 30 **Note De-Identified Kohli Obfuscation** tablet, Rfl: 3 .  simvastatin (ZOCOR) 40 MG tablet, TAKE 1 TABLET (40 MG TOTAL) BY MOUTH DAILY., Disp: 30 tablet, Rfl: 11   Medication Side Effects: none  Family Medical/ Social History: Changes? Yes with cardio workouts in the gym for goal weight  currently 186 pounds.  MENTAL HEALTH EXAM: Muscle strengths and tone 5/5, postural reflexes and gait 0/0, and AIMS = 0. Blood pressure 118/74, pulse 70, height 6' (1.829 m), weight 190 lb (86.2 kg).Body mass index is 25.77 kg/m.  General Appearance: Casual, Meticulous and Well Groomed  Eye Contact:  Good  Speech:  Clear and Coherent and Normal Rate  Volume:  Normal  Mood:  Anxious, Dysphoric and Irritable  Affect:  Labile, Full Range and Anxious  Thought Process:  Goal Directed  Orientation:  Full (Time, Place, and Person)  Thought Content: Rumination   Suicidal Thoughts:  No  Homicidal Thoughts:  No  Memory:  Immediate;   Good Remote;   Good  Judgement:  Good  Insight:  Good  Psychomotor Activity:  Normal  Concentration:  Concentration: Good and Attention Span: Good  Recall:  Good  Fund of Knowledge: Good  Language: Good  Assets:  Resilience Talents/Skills Vocational/Educational  ADL's:  Intact  Cognition: WNL  Prognosis:  Good    DIAGNOSES:    ICD-10-CM   1. Bipolar I disorder, moderate, current or most recent episode depressed, in full remission, with mixed features (HCC) F31.76   2. Chronic post-traumatic stress disorder F43.12     Receiving Psychotherapy: Yes   AA   RECOMMENDATIONS: Jared Molina reports current supply of medications cumulative from the past been icluding Klonopin having 0.5 mg twice daily as needed 08/12/2019 for bipolar agitation or PTSD.  Jared Molina has Depakote 500 mg ER taking 2 tablets every bedtime and Zyprexa 2.5 mg tablet every bedtime having current supply for bipolar and PTSD likeey at least a couple of months remaining to return in 6 months preferring to request refill when needed in case of insurance change denying any impulse for noncompliance seeming to prefer this means of staying in collaboration.   Chauncey Mann, MD

## 2019-03-03 ENCOUNTER — Telehealth: Payer: Self-pay | Admitting: Psychiatry

## 2019-03-03 NOTE — Telephone Encounter (Signed)
**Note De-Identified [place] Obfuscation** Nassim is going to apply for a concealed weapon permit.  He wants to discuss your thoughts on this matter especially since you'll have to sign off on it.  Please call.  Next appt 05/18/19

## 2019-03-03 NOTE — Telephone Encounter (Signed)
**Note De-Identified [place] Obfuscation** Phone call returned to Mr. Champeau reaching only his personalized answering machine documenting the scope of psychiatric interface if any with [facility] permits for concealed carry so that he can make the best approach and decision.

## 2019-03-09 ENCOUNTER — Other Ambulatory Visit: Payer: Self-pay | Admitting: Psychiatry

## 2019-03-09 DIAGNOSIS — F4312 Post-traumatic stress disorder, chronic: Secondary | ICD-10-CM

## 2019-03-09 DIAGNOSIS — F3176 Bipolar disorder, in full remission, most recent episode depressed: Secondary | ICD-10-CM

## 2019-03-10 ENCOUNTER — Other Ambulatory Visit: Payer: Self-pay

## 2019-03-10 DIAGNOSIS — F4312 Post-traumatic stress disorder, chronic: Secondary | ICD-10-CM

## 2019-03-10 DIAGNOSIS — F3176 Bipolar disorder, in full remission, most recent episode depressed: Secondary | ICD-10-CM

## 2019-03-10 MED ORDER — OLANZAPINE 2.5 MG PO TABS: 2.5000 mg | ORAL_TABLET | Freq: Every day | ORAL | 0 refills | Status: DC

## 2019-03-10 MED ORDER — DIVALPROEX SODIUM ER 500 MG PO TB24: 1000.0000 mg | ORAL_TABLET | Freq: Every day | ORAL | 0 refills | Status: DC

## 2019-03-16 ENCOUNTER — Telehealth: Payer: Self-pay | Admitting: Psychiatry

## 2019-03-16 NOTE — Telephone Encounter (Signed)
**Note De-Identified [place] Obfuscation** Pt calling back to discuss conceal carry permit in [facility].

## 2019-03-16 NOTE — Telephone Encounter (Signed)
**Note De-Identified Perales Obfuscation** Patient reads a question from his application in [facility] for concealed carry as we discuss his past psychiatric hospitalization, his current medications and appointments monitoring medication,  and well controlled past symptoms relative to his current appropriateness for the permit.

## 2019-05-18 ENCOUNTER — Encounter: Payer: Self-pay | Admitting: Psychiatry

## 2019-05-18 ENCOUNTER — Other Ambulatory Visit: Payer: Self-pay

## 2019-05-18 ENCOUNTER — Ambulatory Visit (INDEPENDENT_AMBULATORY_CARE_PROVIDER_SITE_OTHER): Payer: Commercial Managed Care - PPO | Admitting: Psychiatry

## 2019-05-18 VITALS — Ht 71.5 in | Wt 188.0 lb

## 2019-05-18 DIAGNOSIS — F1021 Alcohol dependence, in remission: Secondary | ICD-10-CM

## 2019-05-18 DIAGNOSIS — F4312 Post-traumatic stress disorder, chronic: Secondary | ICD-10-CM

## 2019-05-18 DIAGNOSIS — F3176 Bipolar disorder, in full remission, most recent episode depressed: Secondary | ICD-10-CM | POA: Diagnosis not present

## 2019-05-18 MED ORDER — OLANZAPINE 2.5 MG PO TABS: 2.5000 mg | ORAL_TABLET | Freq: Every day | ORAL | 1 refills | Status: DC

## 2019-05-18 MED ORDER — DIVALPROEX SODIUM ER 500 MG PO TB24: 1000.0000 mg | ORAL_TABLET | Freq: Every day | ORAL | 1 refills | Status: DC

## 2019-05-18 NOTE — Progress Notes (Signed)
**Note De-Identified [place] [facility]bfuscation** [facility]  Patient ID: Jared Molina,  MRN: [id number]  PCP: Eartha InchBadger, Michael C, MD  Date of Evaluation: 05/18/2019 Time spent:20 minutes from 1300 to 1320  Chief Complaint:  Chief Complaint    Depression; Manic Behavior; Anxiety; Stress      HISTORY/CURRENT STATUS: Jared Molina is seen onsite in office face-to-face with consent with epic collateral individually for psychiatric interview and exam in 7819-month evaluation and management of bipolar most recently depressed in partial remission, episodic anxiety possibly consistent with early childhood PTSD, and alcohol use disorder in sustained remission.  Jared Molina had a brief period of foreboding insomnia of a day or 2 in March when he took Klonopin at night restoring sleep and helping him reorganize emotion for constructive behavior.  Wife was not particularly worried.  In the interim he has had phone call from a friend in [place] asking about the help patient has received as the man's wife was having similar symptoms.  He has occasional Zoom AA meeting but they cannot meet in person due to government nor can he go to the gym to workout.  He restricts his sugar for months and then adjusted his diet overall in other ways planning his yearly general medical exam in October where he expects to receive another lipid and glucose metabolic checkup.  He notes that his PCP ask about the PTSD diagnosis explained to him as based on his episodic dissociative insomnia which he now informs me has not only occured in the fall but also in September, May, and June remotely.  He dates his earliest memory from age 27 years at a birthday party. He notes his son can remember back to 89 or 89 years of age.  His younger sister attempts to keep in touch but middle sister refuses any contact with the family for 20 years. Sister informed him that things happened in the family he cannot recall. Jared Molina keeps his anxiety and mood stable so he is less liabile to relapse in alcohol.  He is  very busy on the job but has no travel now, so he and wife are working from home.  They did eat out when they went to see parents in [place] last week upsetting his healthy diet.  He has no mania, psychosis, suicidality, delirium, or dissociation today.  Depression         The patient presents with depression as a recurrent problem with current episode starting more than 1 month ago.   The onset quality is sudden.   The problem occurs intermittently.  The problem has been gradually improving since onset.  Associated symptoms include irritable and decreased interest.  Associated symptoms include no decreased concentration, no fatigue, no helplessness, no hopelessness, does not have insomnia, no restlessness, no appetite change, no body aches, no myalgias, no headaches, no indigestion, not sad and no suicidal ideas.     The symptoms are aggravated by work stress, social issues and family issues.  Past treatments include other medications and psychotherapy.  Compliance with treatment is variable.  Past compliance problems include difficulty with treatment plan, medication issues and medical issues.  Previous treatment provided significant relief.  Risk factors include family history of mental illness, family history, history of mental illness, history of suicide attempt, major life event, prior psychiatric admission, prior traumatic experience, stress and substance abuse.   Past medical history includes anxiety, bipolar disorder, depression, mental health disorder and post-traumatic stress disorder.     Pertinent negatives include no chronic illness, no life-threatening condition, **Note De-Identified [place] Obfuscation** no recent psychiatric admission, no eating disorder, no obsessive-compulsive disorder, no schizophrenia, no suicide attempts and no head trauma.  Individual Medical History/ Review of Systems: Changes? :Yes He is scheduled for October general medical exam by PCP having extensive sobriety knowing himself fully and well from 12-step AA  being busy with work on the job at home with no opportunity for the gym due to closure.  [place] registry documents last Klonopin fill being 08/15/2018 therefore using infrequently as 1 dose in March for that pre-dissociative insomnia.  Allergies: Patient has no known allergies.  Current Medications:  Current Outpatient Medications:  .  clonazePAM (KLONOPIN) 0.5 MG tablet, Take 1 tablet (0.5 mg total) by mouth 2 (two) times daily as needed for anxiety., Disp: 30 tablet, Rfl: 0 .  divalproex (DEPAKOTE ER) 500 MG 24 hr tablet, Take 2 tablets (1,000 mg total) by mouth at bedtime., Disp: 180 tablet, Rfl: 1 .  OLANZapine (ZYPREXA) 2.5 MG tablet, Take 1 tablet (2.5 mg total) by mouth at bedtime., Disp: 90 tablet, Rfl: 1 .  simvastatin (ZOCOR) 40 MG tablet, TAKE 1 TABLET (40 MG TOTAL) BY MOUTH DAILY., Disp: 30 tablet, Rfl: 11   Medication Side Effects: none  Family Medical/ Social History: Changes? No  MENTAL HEALTH EXAM:  Height 5' 11.5" (1.816 m), weight 188 lb (85.3 kg).Body mass index is 25.86 kg/m.  Others deferred for coronavirus pandemic  General Appearance: Casual, Guarded, Meticulous and Well Groomed  Eye Contact:  Good  Speech:  Clear and Coherent, Normal Rate and Talkative  Volume:  Normal  Mood:  Anxious, Dysphoric and Euthymic  Affect:  Appropriate, Full Range and Anxious  Thought Process:  Coherent, Goal Directed and Irrelevant  Orientation:  Full (Time, Place, and Person)  Thought Content: Rumination   Suicidal Thoughts:  No  Homicidal Thoughts:  No  Memory:  Immediate;   Good Remote;   Good  Judgement:  Good  Insight:  Good  Psychomotor Activity:  Normal and Mannerisms  Concentration:  Concentration: Fair and Attention Span: Good  Recall:  Good  Fund of Knowledge: Good  Language: Good  Assets:  Desire for Improvement Leisure Time Resilience Social Support Talents/Skills Vocational/Educational  ADL's:  Intact  Cognition: WNL  Prognosis:  Good    DIAGNOSES:     ICD-10-CM   1. Bipolar I disorder, moderate, current or most recent episode depressed, in full remission, with mixed features (HCC)  F31.76 divalproex (DEPAKOTE ER) 500 MG 24 hr tablet    OLANZapine (ZYPREXA) 2.5 MG tablet  2. Chronic post-traumatic stress disorder  F43.12 divalproex (DEPAKOTE ER) 500 MG 24 hr tablet    OLANZapine (ZYPREXA) 2.5 MG tablet  3. Alcohol use disorder, moderate, in sustained remission ([place])  F10.21     Receiving Psychotherapy: No Other than attending when possible AAA activities largely shut down similar to the gyms having a detrimental effect for the pandemic   RECOMMENDATIONS: Waunita Schooner accepts some degree of reassurance and affirmation for his accomplishments particularly in therapeutics, his overcoming of past difficulties, and his quick and comprehensive management of stressors early now when they occur.  He is E scribed Depakote 500 mg ER taking 2 tablets total 1000 mg every bedtime sent as number 180 tablets with 1 refill to [facility] [phone] [facility] for bipolar and PTSD.  He is E scribed Zyprexa 2.5 mg every bedtime sent as a 90-day supply and 1 refill to [facility] [phone] [facility] for bipolar disorder.  He has current supply of Klonopin 0.5 mg **Note De-Identified [place]s Obfuscation** twice daily if needed for anxiety or insomnia from August 11, 2018 #30 with no refill for bipolar and PTSD rarely needed to return in 6 months for follow-up.   Chauncey MannGlenn E , MD

## 2019-05-28 ENCOUNTER — Telehealth: Payer: Self-pay | Admitting: Psychiatry

## 2019-05-28 NOTE — Telephone Encounter (Signed)
**Note De-Identified [place] Obfuscation** Phone clarification of psychiatric records for [facility] department are provided to [employer] in support of application by patient for consealed carry permit.

## 2019-10-23 ENCOUNTER — Telehealth: Payer: Self-pay | Admitting: Psychiatry

## 2019-10-23 ENCOUNTER — Other Ambulatory Visit: Payer: Self-pay

## 2019-10-23 DIAGNOSIS — F3176 Bipolar disorder, in full remission, most recent episode depressed: Secondary | ICD-10-CM

## 2019-10-23 DIAGNOSIS — F4312 Post-traumatic stress disorder, chronic: Secondary | ICD-10-CM

## 2019-10-23 MED ORDER — OLANZAPINE 2.5 MG PO TABS: 2.5000 mg | ORAL_TABLET | Freq: Every day | ORAL | 0 refills | Status: DC

## 2019-10-23 MED ORDER — DIVALPROEX SODIUM ER 500 MG PO TB24: 1000.0000 mg | ORAL_TABLET | Freq: Every day | ORAL | 0 refills | Status: DC

## 2019-10-23 NOTE — Telephone Encounter (Signed)
**Note De-Identified [place] Obfuscation** Patient called and said that he is out of town and he needs 4 to 5 days of his depakote 500 mg and his olanzapine 2.5 mg to be [facility] [address]  At [address] [place] [employer] [address] phone  Number [phone]

## 2019-10-23 NOTE — Telephone Encounter (Signed)
**Note De-Identified Brearley Obfuscation** 5 day supply for Depakote ER 500 mg 2 at hs #10, no refills and Olanzapine 2.5 mg 1 at hs, #5, no refills submitted to [facility] in [place]

## 2019-11-16 ENCOUNTER — Ambulatory Visit: Payer: Commercial Managed Care - PPO | Admitting: Psychiatry

## 2019-11-18 ENCOUNTER — Ambulatory Visit: Payer: Commercial Managed Care - PPO | Admitting: Psychiatry

## 2019-11-23 ENCOUNTER — Other Ambulatory Visit: Payer: Self-pay

## 2019-11-23 ENCOUNTER — Ambulatory Visit (INDEPENDENT_AMBULATORY_CARE_PROVIDER_SITE_OTHER): Payer: Commercial Managed Care - PPO | Admitting: Psychiatry

## 2019-11-23 ENCOUNTER — Encounter: Payer: Self-pay | Admitting: Psychiatry

## 2019-11-23 VITALS — Ht 71.5 in | Wt 190.0 lb

## 2019-11-23 DIAGNOSIS — F4312 Post-traumatic stress disorder, chronic: Secondary | ICD-10-CM | POA: Diagnosis not present

## 2019-11-23 DIAGNOSIS — F3176 Bipolar disorder, in full remission, most recent episode depressed: Secondary | ICD-10-CM | POA: Diagnosis not present

## 2019-11-23 DIAGNOSIS — F1021 Alcohol dependence, in remission: Secondary | ICD-10-CM | POA: Diagnosis not present

## 2019-11-23 MED ORDER — DIVALPROEX SODIUM ER 500 MG PO TB24: 1000.0000 mg | ORAL_TABLET | Freq: Every day | ORAL | 1 refills | Status: DC

## 2019-11-23 MED ORDER — OLANZAPINE 2.5 MG PO TABS: 1.2500 mg | ORAL_TABLET | Freq: Every day | ORAL | 1 refills | Status: DC

## 2019-11-23 NOTE — Progress Notes (Addendum)
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  Patient has done well in the interim but hesitates to establish confidence that he will not relapse in alcohol or bipolar mood decompensation.  He has no further consolidation himself of any recall from early childhood of what alienated older sister so she has nothing to do with family telling him that bad things happen that he does not remember as he was too young.  [place] to [place] twice, such that at one time being delayed he called to get a 5-day supply of his medications sent there.  He is otherwise sober doing well having no use of Klonopin even over the fall 2020 with fall his most vulnerable time for recurrence. In the interim missing his dose of olanzapine and Depakote at times, his Depakote level is low therapeutic at 6.1 free valproic acid with reference range 6-22 at recent general medical exam by Dr. BFayrene Fearingoffice.  His lipids were normal on his Zocor. However he notes that he has to continue to struggle to keep his weight down planning to run when the weather warms up but now in the gym frequently.  We process reducing the olanzapine to every other day if his occasional missing the medication does not have any legacy of mood swing diathesis.  Otherwise, we discuss reducing the olanzapine by one half as he has a pill cutter and thinks he can do this but  would rather wait till the weather is better next month.  He did not receive his concealed carry permit nor life insurance that he applied for to supplement what he already has because of his mental health diathesis.  Therefore tapering off olanzapine has multitargeted goals while he can continue the Depakote. Son and wife had a baby so he is now a grandfather.  He has his most positive time with his dog he would like to see be a support animal, though he is not training him for such.  He has no mania, suicidality, psychosis or delirium.  Depression         The patient presents withdepression as a recurrentproblem with last episode starting more than 1 year ago. The onset quality is sudden. The problem occurs intermittently.The problem has been gradually improvingsince onset.Associated symptoms include irritable, reenactment insomnia and reactivity,appetite change, and decreased interest. Associated symptoms include no decreased concentration,no fatigue,no helplessness,no hopelessness,no restlessness,no body aches,no myalgias,no headaches,no indigestion,no sadnessand no suicidal ideas.The symptoms are aggravated by work stress, social issues and family issues.Past treatments include other medications and psychotherapy.Compliance with treatment is variable.Past compliance problems include difficulty with treatment plan, medication issues and medical issues.Previous treatment provided significantrelief.Risk factors include family history of mental illness, family history, history of mental illness, history of suicide attempt, major life event, prior psychiatric admission, prior traumatic experience, stress and substance abuse. Past medical history includes anxiety,bipolar disorder,depression,mental health disorderand post-traumatic stress disorder. Pertinent negatives include no chronic illness,no life-threatening condition,no recent psychiatric admission,no **Note De-Identified [place] Obfuscation** eating  disorder,no obsessive-compulsive disorder,no schizophrenia,no suicide attemptsand no head trauma.  Individual Medical History/ Review of Systems: Changes? :Yes General medical exam by Dr. Melford Aase 11/03/2019:  LP+Non-HDL Cholesterol (11/03/2019 12:00 AM EST) LP+Non-HDL Cholesterol (11/03/2019 12:00 AM EST)  Component Value Ref Range Performed At Pathologist Signature  Cholesterol, Total 148 100 - 199 mg/dL [facility]   Triglycerides 79 0 - 149 mg/dL [facility]   HDL 44 >39 mg/dL [facility]   VLDL Cholesterol Cal 15 5 - 40 mg/dL [facility]   LDL 89 0 - 99 mg/dL [facility]    Valproic Acid Level, Free (11/03/2019 12:00 AM EST) Valproic Acid Level, Free (11/03/2019 12:00 AM EST)  Component Value Ref Range Performed At Pathologist Signature  Valproic Acid, Free 6.1Comment:  Detection Limit = 0.5      Comprehensive Metabolic Panel (65/46/5035 12:00 AM EST) Comprehensive Metabolic Panel (46/56/8127 12:00 AM EST)  Component Value Ref Range Performed At Pathologist Signature  Glucose 86 65 - 99 mg/dL [facility]   BUN 12 8 - 27 mg/dL [facility]   Creatinine 0.86 0.76 - 1.27 mg/dL [facility]   eGFR If NonAfrican American 94 >59 mL/min/1.73 [facility]   eGFR If African American 109 >59 mL/min/1.73 [facility]   BUN/Creatinine Ratio 14 10 - 24 [facility]   Sodium 141 134 - 144 mmol/L [facility]   Potassium 5.8 (H) 3.5 - 5.2 mmol/L [facility]   Chloride 102 96 - 106 mmol/L [facility]   CO2 23 20 - 29 mmol/L [facility]   CALCIUM 9.4 8.6 - 10.2 mg/dL [facility]   Total Protein 6.3 6.0 - 8.5 g/dL [facility]   Albumin, Serum 4.2 3.8 - 4.9 g/dL [facility]   Globulin, Total 2.1 1.5 - 4.5 g/dL [facility]   Albumin/Globulin Ratio 2.0 1.2 - 2.2 [facility]   Total Bilirubin 0.3 0.0 - 1.2 mg/dL [facility]   Alkaline Phosphatase 47 39 - 117 IU/L [facility]   AST 20 0 - 40 IU/L [facility]   ALT (SGPT) 22 0 - 44 IU/L [facility]    Allergies: Patient has no known allergies.  Current Medications:   Current Outpatient Medications:  .  clonazePAM (KLONOPIN) 0.5 MG tablet, Take 1 tablet (0.5 mg total) by mouth 2 (two) times daily as needed for anxiety., Disp: 30 tablet, Rfl: 0 .  divalproex (DEPAKOTE ER) 500 MG 24 hr tablet, Take 2 tablets (1,000 mg total) by mouth at bedtime., Disp: 180 tablet, Rfl: 1 .  OLANZapine (ZYPREXA) 2.5 MG tablet, Take 0.5 tablets (1.25 mg total) by mouth at bedtime., Disp: 45 tablet, Rfl: 1 .  simvastatin (ZOCOR) 40 MG tablet, TAKE 1 TABLET (40 MG TOTAL) BY MOUTH DAILY., Disp: 30 tablet, Rfl: 11  Medication Side Effects: weight gain  Family Medical/ Social History: Changes? No  MENTAL HEALTH EXAM:  Height 5' 11.5" (1.816 m), weight 190 lb (86.2 kg).Body mass index is 26.13 kg/m. Muscle strengths and tone 5/5, postural reflexes and gait 0/0, and AIMS = 0 otherwise deferred for coronavirus shutdown  General Appearance: Casual, Fairly Groomed, Guarded and Meticulous  Eye Contact:  Fair  Speech:  Blocked, Clear and Coherent, Normal Rate and Talkative  Volume:  Normal  Mood:  Anxious, Dysphoric and Euthymic  Affect:  Congruent, Inappropriate, Restricted and Anxious  Thought Process:  Coherent, Goal Directed, Irrelevant and Descriptions of Associations: Circumstantial and Tangential  Orientation:  Full (Time, Place, and Person)  Thought Content: Obsessions, **Note De-Identified [place] Obfuscation** Rumination and Tangential  Suicidal Thoughts:  No  Homicidal Thoughts:  No  Memory:  Immediate;   Good Remote;   Fair  Judgement:  Good  Insight:  Fair  Psychomotor Activity:  Normal and Mannerisms  Concentration:  Concentration: Fair and Attention Span: Good  Recall:  Good  Fund of Knowledge: Good  Language: Good  Assets:  Desire for Improvement Resilience Talents/Skills Vocational/Educational  ADL's:  Intact  Cognition: WNL  Prognosis:  Good to fair patient always expectant of recurrence ?PTSD    DIAGNOSES:    ICD-10-CM   1. Bipolar I disorder, moderate, current or most recent episode  depressed, in full remission, with mixed features (HCC)  F31.76 divalproex (DEPAKOTE ER) 500 MG 24 hr tablet    OLANZapine (ZYPREXA) 2.5 MG tablet  2. Chronic post-traumatic stress disorder  F43.12 divalproex (DEPAKOTE ER) 500 MG 24 hr tablet    OLANZapine (ZYPREXA) 2.5 MG tablet  3. Alcohol use disorder, moderate, in sustained remission (HCC)  F10.21     Receiving Psychotherapy: No    RECOMMENDATIONS: Psychosupportive psychoeducation mobilizes cognitive behavioral nutrition, sleep hygiene, object relations and frustration management interventions for prevention,monitoring, and safety hygiene as symptom treatment matching with medications is completed.  We process the risks versus benefits of stopping Zyprexa and continuing Depakote alone which he finds stressful similar to his inability to account for recurrences or relapses in the past.  The only indicator has been the fall of the year and older sister statements that he would not likely remember the trauma he was exposed to in the family as a young child.  He wants to lose weight and is exercising, attending his general medical care, and careful with his diet.  He was denied more life insurance and concealed carry such that tapering and discontinuing Zyprexa is likely of more benefit than continuing.  He is E scribed Depakote 500 mg ER taking 2 tablets every bedtime sent as #180 and 1 refill to [facility].  He is E scribed a reduced dose of Zyprexa 2.5 mg tablet to take 1/2 tablet every bedtime he will start when the spring weather arrives sent as #45 with 1 refill to [facility].  He continues his Zocor for hyperlipidemia well controlled.  He prefers and agrees to return for follow-up in 3 months considering the potential medication change if he complies in the interim.  Delight Hoh, MD

## 2020-01-11 ENCOUNTER — Other Ambulatory Visit: Payer: Self-pay | Admitting: Psychiatry

## 2020-01-11 DIAGNOSIS — F4312 Post-traumatic stress disorder, chronic: Secondary | ICD-10-CM

## 2020-01-11 MED ORDER — CLONAZEPAM 0.5 MG PO TABS: 0.5000 mg | ORAL_TABLET | Freq: Two times a day (BID) | ORAL | 1 refills | Status: DC | PRN

## 2020-01-11 NOTE — Telephone Encounter (Signed)
**Note De-Identified [place] Obfuscation** Patient called and said that his father passed away yesterday and he needs a refill on his klonpin to be sent to the [facility] on [address].

## 2020-01-11 NOTE — Telephone Encounter (Signed)
**Note De-Identified Alber[place] Obfuscation** Patient notifies the office that his father has died and that he will need his Klonopin renewed which has been helpful to prevent affective and psychotic decompensations in the past whether bipolar and/or PTSD. Sleep maintenance is most important. Last fill per [place] registry was 08/11/2018 sending #30 with 1 refill of 0.5 mg twice daily as needed to his [facility]

## 2020-02-29 ENCOUNTER — Ambulatory Visit (INDEPENDENT_AMBULATORY_CARE_PROVIDER_SITE_OTHER): Payer: Commercial Managed Care - PPO | Admitting: Psychiatry

## 2020-02-29 ENCOUNTER — Other Ambulatory Visit: Payer: Self-pay

## 2020-02-29 ENCOUNTER — Encounter: Payer: Self-pay | Admitting: Psychiatry

## 2020-02-29 VITALS — Ht 71.5 in | Wt 196.0 lb

## 2020-02-29 DIAGNOSIS — F3176 Bipolar disorder, in full remission, most recent episode depressed: Secondary | ICD-10-CM

## 2020-02-29 DIAGNOSIS — F4312 Post-traumatic stress disorder, chronic: Secondary | ICD-10-CM | POA: Diagnosis not present

## 2020-02-29 DIAGNOSIS — F1021 Alcohol dependence, in remission: Secondary | ICD-10-CM | POA: Diagnosis not present

## 2020-02-29 NOTE — Progress Notes (Signed)
**Note De-Identified [place] Obfuscation** [facility]  Patient ID: Jared Molina,  MRN: [id number]  PCP: Eartha Inch, MD  Date of Evaluation: 02/29/2020 Time spent:20 minutes from 1320 to 1340  Chief Complaint:   HISTORY/CURRENT STATUS: Jared Molina is seen onsite in office 20 minutes individually with consent with epic collateral for psychiatric interview and exam in 38-month evaluation and management of bipolar 1 most recently depressed, chronic vicarious PTSD, and use disorder in full sustained remission.  He has remained supportive to others and preventative himself for any potential exposure to alcohol by AA being a leader and sponsor.  He had no decompensations in the last year after episodes of requiring hospitalization for disorganization of behavior and thought with associated risk prevented seemingly by assuring his sleep and self control with Klonopin.  He has done this effectively now with his father's death in 2023/01/29, being prepared for the death by hospice father having memory loss and health decompensations.  Wife lost her mother in the past.  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  He has considered taking with him his service dog and inquires of me whether he needs some medical evaluation to do so.  I suggest that he join one of the authorizing organizations and if some medical signature or report is needed we will provide that, though he knows there are [place] laws that assure he can take his service dog to meetings there.  He has been taking more Klonopin with the death of father needing refill taking 18 of these for sleep and agitation since last dispensing per [place] registry 01/11/2020 of #30 having 1 refill remaining.  He also has a 62-month refill remaining for his Zyprexa successfully reduced to 1.25 mg nightly and for his Depakote dose through [facility] as of March 1 eScription.   He is continuing to work out  thinking he has some weight gain measured as 6 pounds on scale here.  He has no mania, suicidality, psychosis or delirium.  Depression The patient presents withdepressionasa recurrentproblemwithlast episode startingmore than 15 months ago. The onset quality is sudden. The problem occurs intermittently.The problem has been gradually improvingsince onset.Associated symptoms include irritable, reenactment insomnia, mourning grief and loss, reactive sadness, and decreased interest. Associated symptoms include no appetite change, no decreased concentration,no fatigue,no helplessness,no hopelessness,no restlessness,no body aches,no myalgias,no headaches,no indigestion,and no suicidal ideas.The symptoms are aggravated by work stress, social issues and family issues.Past treatments include other medications and psychotherapy.Compliance with treatment is variable.Past compliance problems include difficulty with treatment plan, medication issues and medical issues.Previous treatment provided significantrelief.Risk factors include family history of mental illness, family history, history of mental illness, history of suicide attempt, major life event, prior psychiatric admission, prior traumatic experience, stress and substance abuse. Past medical history includes anxiety,bipolar disorder,depression,mental health disorderand post-traumatic stress disorder. Pertinent negatives include no chronic illness,no life-threatening condition,no recent psychiatric admission,no eating disorder,no obsessive-compulsive disorder,no schizophrenia,no suicide attemptsand no head trauma.  Individual Medical History/ Review of Systems: Changes? :Yes Dr. Cyndia Bent monitors hyperlipidemia treated with Zocor and thyroid nodule last free Depakote level 6.1 with lower limit of normal 6  Allergies: Patient has no known allergies.  Current Medications:  Current Outpatient  Medications:  .  clonazePAM (KLONOPIN) 0.5 MG tablet, Take 1 tablet (0.5 mg total) by mouth 2 (two) times daily as needed for anxiety., Disp: 30 tablet, Rfl: 1 .  divalproex (DEPAKOTE ER) 500 MG 24 hr tablet, Take 2 **Note De-Identified Polidori Obfuscation** tablets (1,000 mg total) by mouth at bedtime., Disp: 180 tablet, Rfl: 1 .  OLANZapine (ZYPREXA) 2.5 MG tablet, Take 0.5 tablets (1.25 mg total) by mouth at bedtime., Disp: 45 tablet, Rfl: 1 .  simvastatin (ZOCOR) 40 MG tablet, TAKE 1 TABLET (40 MG TOTAL) BY MOUTH DAILY., Disp: 30 tablet, Rfl: 11  Medication Side Effects: none  Family Medical/ Social History: Changes? No  MENTAL HEALTH EXAM:  Height 5' 11.5" (1.816 m), weight 196 lb (88.9 kg).Body mass index is 26.96 kg/m. Muscle strengths and tone 5/5, postural reflexes and gait 0/0, and AIMS = 0.  General Appearance: Casual, Guarded, Meticulous and Well Groomed  Eye Contact:  Good  Speech:  Clear and Coherent, Normal Rate and Talkative  Volume:  Normal  Mood:  Anxious, Dysphoric and Euthymic  Affect:  Congruent, Inappropriate, Full Range and Anxious  Thought Process:  Coherent, Goal Directed, Irrelevant and Descriptions of Associations: Tangential  Orientation:  Full (Time, Place, and Person)  Thought Content: Rumination and Tangential   Suicidal Thoughts:  No  Homicidal Thoughts:  No  Memory:  Immediate;   Good Remote;   Fair  Judgement:  Good  Insight:  Fair  Psychomotor Activity:  Normal and Mannerisms  Concentration:  Concentration: Fair and Attention Span: Good  Recall:  Good  Fund of Knowledge: Good  Language: Good  Assets:  Desire for Improvement Intimacy Leisure Time Vocational/Educational  ADL's:  Intact  Cognition: WNL  Prognosis:  Good    DIAGNOSES:    ICD-10-CM   1. Bipolar I disorder, moderate, current or most recent episode depressed, in full remission, with mixed features ([place])  F31.76   2. Chronic post-traumatic stress disorder  F43.12   3. Alcohol use disorder, moderate, in sustained  remission ([place])  F10.21     Receiving Psychotherapy: No    RECOMMENDATIONS: Prevention and monitoring safety hygiene are updated with cognitive behavioral reworking especially for grief and loss for her father's death.  Patient may seeks registration of his therapist some weight that he is confident he can the dog with him patient at this time of loss though patient is encouraged that he is effective at all his occupational and relational duties now.  He has current refills for Zyprexa 2.5 mg tablet taking one half every bedtime and Depakote 500 mg ER taking 2 tablets every bedtime on a scheduled basis for bipolar 1 and chronic vicarious PTSD.  He has refill for Klonopin 0.5 mg twice daily as needed #30 having 12 remaining currently at [facility].  He returns for follow-up in 3 months or sooner if needed.   Delight Hoh, MD

## 2020-06-10 ENCOUNTER — Telehealth: Payer: Self-pay | Admitting: Psychiatry

## 2020-06-13 ENCOUNTER — Other Ambulatory Visit: Payer: Self-pay | Admitting: Psychiatry

## 2020-06-13 DIAGNOSIS — F4312 Post-traumatic stress disorder, chronic: Secondary | ICD-10-CM

## 2020-06-13 DIAGNOSIS — F3176 Bipolar disorder, in full remission, most recent episode depressed: Secondary | ICD-10-CM

## 2020-06-13 NOTE — Telephone Encounter (Signed)
**Note De-Identified [place] Obfuscation** Next apt 06/27/20

## 2020-06-13 NOTE — Telephone Encounter (Signed)
**Note De-Identified Finlay Obfuscation** Past alcohol use disorder conservative Klonopin for bipolar and PTSD with no contraindication to medically necessary refills per registry or epic

## 2020-06-13 NOTE — Telephone Encounter (Signed)
**Note De-identified [place] Obfuscation** Error

## 2020-06-20 ENCOUNTER — Ambulatory Visit: Payer: Commercial Managed Care - PPO | Admitting: Psychiatry

## 2020-06-27 ENCOUNTER — Ambulatory Visit: Payer: Commercial Managed Care - PPO | Admitting: Psychiatry

## 2020-07-14 ENCOUNTER — Encounter: Payer: Self-pay | Admitting: Psychiatry

## 2020-09-05 ENCOUNTER — Encounter: Payer: Self-pay | Admitting: Psychiatry

## 2020-09-05 ENCOUNTER — Ambulatory Visit (INDEPENDENT_AMBULATORY_CARE_PROVIDER_SITE_OTHER): Payer: Commercial Managed Care - PPO | Admitting: Psychiatry

## 2020-09-05 ENCOUNTER — Other Ambulatory Visit: Payer: Self-pay

## 2020-09-05 VITALS — Ht 71.5 in | Wt 197.0 lb

## 2020-09-05 DIAGNOSIS — F4312 Post-traumatic stress disorder, chronic: Secondary | ICD-10-CM

## 2020-09-05 DIAGNOSIS — F3176 Bipolar disorder, in full remission, most recent episode depressed: Secondary | ICD-10-CM | POA: Diagnosis not present

## 2020-09-05 DIAGNOSIS — F1021 Alcohol dependence, in remission: Secondary | ICD-10-CM

## 2020-09-05 MED ORDER — OLANZAPINE 2.5 MG PO TABS: 1.2500 mg | ORAL_TABLET | Freq: Every day | ORAL | 1 refills | Status: DC

## 2020-09-05 MED ORDER — DIVALPROEX SODIUM ER 500 MG PO TB24: 1000.0000 mg | ORAL_TABLET | Freq: Every day | ORAL | 1 refills | Status: DC

## 2020-09-05 NOTE — Progress Notes (Signed)
**Note De-Identified [place] Obfuscation** [facility]  Patient ID: Phu Record,  MRN: [id number]  PCP: Eartha Inch, MD  Date of Evaluation: 09/05/2020 Time spent:25 minutes from 1320 to 1345  Chief Complaint:  Chief Complaint    Manic Behavior; Depression; Anxiety      HISTORY/CURRENT STATUS: Jared Molina is seen Onsite in office 25 minutes face-to-face individually with consent with epic collateral for psychiatric interview and exam in 78-month evaluation and management of bipolar depressed with mixed features, provisional chronic PTSD, and alcohol use disorder in sustained remission.  Patient and wife have parental loss in the last year.  Patient addresses my imminent retirement and tends to close over in discussion of emotional and social impact of various stressors in his life.  He has always declined or been unable to recall or open up about episodic decompensations needing hospitalizations for insomnia with associated psychotic symptoms which have now been prevented by having Klonopin on hand to assure that he maintain sleep and bipolar decompensation them does not occur.  Sister has been more open about trauma experiences in her childhood but the patient does not seem to be sharing any such recall. He has not been involved in AA or other activities now.  He continues his Zyprexa and Depakote as well as having that as needed Klonopin which he uses very sparingly.  [place] registry documents last Klonopin dispensing 06/13/2020.  He has 1 refill left of the Klonopin.  He has no current mania, suicidality, psychosis or delirium.  Case closure for my retirement concludes patient willing to transition transfer to Corie Chiquito, PMHNP in 3 months.    Depression The patient presents withdepressionasa recurrentproblemwithlastepisode startingmore than 21 monthsago. The onset quality is sudden. The problem occurs intermittently.The problem has been gradually improvingsince onset.Associated symptoms include  irritability,reenactment insomnia, mourning grief and loss, reactive sadness,and decreased interest. Associated symptoms include no decreased concentration,no fatigue,no helplessness,no hopelessness,no restlessness,no body aches,no myalgias,no headaches,no indigestion,and no suicidal ideas.The symptoms are aggravated by work stress, social issues and family issues.Past treatments include other medications and psychotherapy.Compliance with treatment is variable.Past compliance problems include difficulty with treatment plan, medication issues and medical issues.Previous treatment provided significantrelief.Risk factors include family history of mental illness, family history, history of mental illness, history of suicide attempt, major life event, prior psychiatric admission, prior traumatic experience, stress and substance abuse. Past medical history includes anxiety,bipolar disorder,depression,mental health disorderand post-traumatic stress disorder. Pertinent negatives include no chronic illness,no life-threatening condition,no recent psychiatric admission,no eating disorder,no obsessive-compulsive disorder,no schizophrenia,no suicide attemptsand no head trauma.  Individual Medical History/ Review of Systems: Changes? :Yes Weight is up 1 pound in 6 months and he continues care for lipid disorder.  Allergies: Patient has no known allergies.  Current Medications:  Current Outpatient Medications:  .  clonazePAM (KLONOPIN) 0.5 MG tablet, TAKE ONE TABLET BY MOUTH TWICE A DAY AS NEEDED FOR ANXIETY, Disp: 30 tablet, Rfl: 1 .  divalproex (DEPAKOTE ER) 500 MG 24 hr tablet, Take 2 tablets (1,000 mg total) by mouth at bedtime., Disp: 180 tablet, Rfl: 1 .  OLANZapine (ZYPREXA) 2.5 MG tablet, Take 0.5 tablets (1.25 mg total) by mouth at bedtime., Disp: 45 tablet, Rfl: 1 .  simvastatin (ZOCOR) 40 MG tablet, TAKE 1 TABLET (40 MG TOTAL) BY MOUTH DAILY., Disp: 30 tablet, Rfl:  11 Medication Side Effects: none  Family Medical/ Social History: Changes? No  MENTAL HEALTH EXAM:  Height 5' 11.5" (1.816 m), weight 197 lb (89.4 kg).Body mass index is 27.09 kg/m. Muscle strengths and tone 5/5, postural reflexes **Note De-Identified Chait Obfuscation** and gait 0/0, and AIMS = 0.  General Appearance: Casual, Guarded, Meticulous and Well Groomed  Eye Contact:  Good  Speech:  Clear and Coherent and Normal Rate  Volume:  Normal  Mood:  Anxious, Dysphoric and Irritable  Affect:  Congruent, Inappropriate, Full Range and Anxious  Thought Process:  Coherent, Goal Directed and Descriptions of Associations: Tangential  Orientation:  Full (Time, Place, and Person)  Thought Content: Rumination and Tangential   Suicidal Thoughts:  No  Homicidal Thoughts:  No  Memory:  Immediate;   Good Remote;   Fair  Judgement:  Good  Insight:  Fair  Psychomotor Activity:  Normal and Mannerisms  Concentration:  Concentration: Fair and Attention Span: Good  Recall:  Good  Fund of Knowledge: Good  Language: Good  Assets:  Desire for Improvement Intimacy Leisure Time Vocational/Educational  ADL's:  Intact  Cognition: WNL  Prognosis:  Good    DIAGNOSES:    ICD-10-CM   1. Bipolar I disorder, moderate, current or most recent episode depressed, in full remission, with mixed features (HCC)  F31.76 OLANZapine (ZYPREXA) 2.5 MG tablet    divalproex (DEPAKOTE ER) 500 MG 24 hr tablet  2. Chronic post-traumatic stress disorder  F43.12 OLANZapine (ZYPREXA) 2.5 MG tablet    divalproex (DEPAKOTE ER) 500 MG 24 hr tablet  3. Alcohol use disorder, moderate, in sustained remission (HCC)  F10.21     Receiving Psychotherapy: No    RECOMMENDATIONS: Over the course of 3 to 5 years, the patient has become self-directed in dealing with his own decompensations and not becoming reliant upon AA, hospitalization, or therapy.  Zyprexa has been low dose but preventative as well as as needed Klonopin for times of severe insomnia that become  psychotogenic.  He is E scribed Zyprexa 2.5 mg tablet taking 1/2 tablet total 1.25 mg daily at bedtime sent as #45 and 1 refill to [facility] for bipolar and PTSD.  He is E scribed Depakote 500 mg ER tablet taking 2 every bedtime sent as number 180 tablets with 1 refill to [facility] at [facility].  He hs 1 refill supply left of Klonopin if needed 0.5 mg tablet up to twice daily if needed for insomnia or anxious decompensation.  He returns for follow-up in 3 months with Corie Chiquito, PMHNP as case closure for my retirement is completed updating on prevention and monitoring safety hygiene for medications.  Chauncey Mann, MD

## 2020-12-05 ENCOUNTER — Ambulatory Visit: Payer: Commercial Managed Care - PPO | Admitting: Psychiatry

## 2020-12-12 ENCOUNTER — Other Ambulatory Visit: Payer: Self-pay | Admitting: Psychiatry

## 2020-12-12 DIAGNOSIS — F4312 Post-traumatic stress disorder, chronic: Secondary | ICD-10-CM

## 2020-12-12 NOTE — Telephone Encounter (Signed)
**Note De-Identified P[place]ler Obfuscation** He has apt 04/18

## 2021-01-09 ENCOUNTER — Encounter: Payer: Self-pay | Admitting: Psychiatry

## 2021-01-09 ENCOUNTER — Other Ambulatory Visit: Payer: Self-pay

## 2021-01-09 ENCOUNTER — Ambulatory Visit (INDEPENDENT_AMBULATORY_CARE_PROVIDER_SITE_OTHER): Payer: Commercial Managed Care - PPO | Admitting: Psychiatry

## 2021-01-09 DIAGNOSIS — F3176 Bipolar disorder, in full remission, most recent episode depressed: Secondary | ICD-10-CM | POA: Diagnosis not present

## 2021-01-09 MED ORDER — DIVALPROEX SODIUM ER 500 MG PO TB24
1000.0000 mg | ORAL_TABLET | Freq: Every day | ORAL | 0 refills | Status: DC
Start: 2021-01-09 — End: 2021-04-10

## 2021-01-09 NOTE — Progress Notes (Signed)
**Note De-identified Gradilla Obfuscation**   **Note De-Identified Thurgood Obfuscation** 01/09/21 1324  Facial and Oral Movements  Muscles of Facial Expression 0  Lips and Perioral Area 0  Jaw 0  Tongue 0  Extremity Movements  Upper (arms, wrists, hands, fingers) 0  Lower (legs, knees, ankles, toes) 0  Trunk Movements  Neck, shoulders, hips 0  Overall Severity  Severity of abnormal movements (highest score from questions above) 0  Incapacitation due to abnormal movements 0  Patient's awareness of abnormal movements (rate only patient's report) 0  AIMS Total Score  AIMS Total Score 0
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
## 2021-04-10 ENCOUNTER — Other Ambulatory Visit: Payer: Self-pay

## 2021-04-10 ENCOUNTER — Encounter: Payer: Self-pay | Admitting: Psychiatry

## 2021-04-10 ENCOUNTER — Ambulatory Visit: Payer: Commercial Managed Care - PPO | Admitting: Psychiatry

## 2021-04-10 DIAGNOSIS — F3176 Bipolar disorder, in full remission, most recent episode depressed: Secondary | ICD-10-CM

## 2021-04-10 DIAGNOSIS — F4312 Post-traumatic stress disorder, chronic: Secondary | ICD-10-CM | POA: Diagnosis not present

## 2021-04-10 MED ORDER — DIVALPROEX SODIUM ER 500 MG PO TB24: 1000.0000 mg | ORAL_TABLET | Freq: Every day | ORAL | 1 refills | Status: DC

## 2021-04-10 MED ORDER — CLONAZEPAM 0.5 MG PO TABS: ORAL_TABLET | ORAL | 0 refills | Status: DC

## 2021-04-10 NOTE — Progress Notes (Signed)
**Note De-Identified [place] Obfuscation** Jared Molina [id number] 1960-10-21 60 y.o.  Subjective:   Patient ID:  Jared Molina is a 60 y.o. (DOB Apr 14, 1961) male.  Chief Complaint:  Chief Complaint  [name] with   Follow-up    Bipolar D/O, anxiety, and sleep disturbance    HPI Jared Molina to the office today for follow-up of Bipolar D/O. Wife is getting close to settling her father's estate. He and his wife had COVID in May. He had some sleep disturbance when he was sick and used Klonopin to help regulate sleep. He reports that Klonopin script has lasted over 3 months. He also will use sleepy time tea to help sleep. He reports that dreams about work have resolved. One week he did not take anything at night and slept adequately. He has not taken Olanzapine since last visit.   He reports that performance improvement plan ended in June and his boss reports that he was pleased with his performance and recommended that he be taken off the plan. He reports that he has been taking it "one day at a time." He reports anxiety, "comes and goes." He reports that his mood has been, "a lot better. Stable." He reports that his irritability has gradually improved since he stopped alcohol use. Denies any manic s/s. Energy was decreased after COVID. He reports that energy and motivation have improved and has resumed regular exercise. Concentration has been ok and at times requires more effort. Denies SI.   He reports that he prefers to keep his dog with him. He reports that dog has become an increased source of support since covid, his father's death, and wife being away to settle estate. Reports that dog has completed a 2-week board and train program.   Saw a therapist until 2019. He reports that he does not have any memories before he was 3 or 60 yo.   He has been traveling some for work. Works from home.  Past Psychiatric Medication Trials: Klonopin Zyprexa Risperdal Seroquel Lithium Trazodone  AIMS    Flowsheet Row Office Visit  from 01/09/2021 in [facility]  AIMS Total Score 0      PHQ2-9    Flowsheet Row Office Visit from 07/18/2015 in Primary Care at [facility] Visit from 04/05/2015 in Primary Care at [facility] Total Score 3 0  PHQ-9 Total Score 11 --        Review of Systems:  Review of Systems  Musculoskeletal:  Negative for gait problem.  Neurological:  Negative for tremors.  Psychiatric/Behavioral:         Please refer to HPI   Medications: I have reviewed the patient's current medications.  Current Outpatient Medications  Medication Sig Dispense Refill   cholecalciferol (VITAMIN D3) 25 MCG (1000 UNIT) tablet Take 1,000 Units by mouth daily.     Omega-3 Fatty Acids (FISH OIL) 1000 MG CAPS Take by mouth.     simvastatin (ZOCOR) 40 MG tablet TAKE 1 TABLET (40 MG TOTAL) BY MOUTH DAILY. 30 tablet 11   vitamin B-12 (CYANOCOBALAMIN) 1000 MCG tablet Take 1,000 mcg by mouth daily.     clonazePAM (KLONOPIN) 0.5 MG tablet TAKE ONE TABLET BY MOUTH TWICE A DAY AS NEEDED FOR ANXIETY 30 tablet 0   divalproex (DEPAKOTE ER) 500 MG 24 hr tablet Take 2 tablets (1,000 mg total) by mouth at bedtime. 180 tablet 1   No current facility-administered medications for this visit.    Medication Side Effects: None  Allergies: No Known Allergies  Past **Note De-Identified Romain Obfuscation** Medical History:  Diagnosis Date   Bipolar 1 disorder (HCC)    Depression     Past Medical History, Surgical history, Social history, and Family history were reviewed and updated as appropriate.   Please see review of systems for further details on the patient's review from today.   Objective:   Physical Exam:  There were no vitals taken for this visit.  Physical Exam Constitutional:      General: He is not in acute distress. Musculoskeletal:        General: No deformity.  Neurological:     Mental Status: He is alert and oriented to person, place, and time.     Coordination: Coordination normal.  Psychiatric:        Attention and  Perception: Attention and perception normal. He does not perceive auditory or visual hallucinations.        Mood and Affect: Mood normal. Mood is not anxious or depressed. Affect is not labile, blunt, angry or inappropriate.        Speech: Speech normal.        Behavior: Behavior normal.        Thought Content: Thought content normal. Thought content is not paranoid or delusional. Thought content does not include homicidal or suicidal ideation. Thought content does not include homicidal or suicidal plan.        Cognition and Memory: Cognition and memory normal.        Judgment: Judgment normal.     Comments: Insight intact    Lab Review:     Component Value Date/Time   NA 134 (L) 08/13/2015 1030   K 4.2 08/13/2015 1030   CL 101 08/13/2015 1030   CO2 25 08/13/2015 1030   GLUCOSE 101 (H) 08/13/2015 1030   BUN 7 08/13/2015 1030   CREATININE 0.88 08/13/2015 1030   CREATININE 0.86 04/05/2015 0908   CALCIUM 9.3 08/13/2015 1030   PROT 6.8 04/05/2015 0908   ALBUMIN 4.3 04/05/2015 0908   AST 16 04/05/2015 0908   ALT 26 04/05/2015 0908   ALKPHOS 46 04/05/2015 0908   BILITOT 0.6 04/05/2015 0908   GFRNONAA >60 08/13/2015 1030   GFRNONAA >89 04/05/2015 0908   GFRAA >60 08/13/2015 1030   GFRAA >89 04/05/2015 0908       Component Value Date/Time   WBC 5.9 08/13/2015 1030   RBC 5.00 08/13/2015 1030   HGB 15.2 08/13/2015 1030   HCT 45.1 08/13/2015 1030   PLT 289 08/13/2015 1030   MCV 90.2 08/13/2015 1030   MCH 30.4 08/13/2015 1030   MCHC 33.7 08/13/2015 1030   RDW 12.6 08/13/2015 1030   LYMPHSABS 0.9 08/13/2015 1030   MONOABS 0.8 08/13/2015 1030   EOSABS 0.0 08/13/2015 1030   BASOSABS 0.0 08/13/2015 1030    No results found for: POCLITH, LITHIUM   Lab Results  Component Value Date   VALPROATE 85 08/17/2015     .res Assessment: Plan:   Patient seen for 30 minutes and time spent discussing his desire to have his dog certified as a Administrator, Civil Service.  Also discussed his questions  about resuming psychotherapy and provided possible referrals.  Patient reports that he will consider starting therapy and will contact office if he would like to schedule an appointment. Labs reviewed from medical provider at [facility] and discussed that his lab results were within in normal limits for CBC and CMP. Continue Depakote ER 1000 mg po QHS for mood stabilization. Continue Klonopin 0.5 mg twice daily as needed for **Note De-Identified [place]gen[place] Obfuscation** anxiety.   Patient to follow-up in 3 months or sooner if clinically indicated. Patient advised to contact office with any questions, adverse effects, or acute worsening in signs and symptoms.   Jared Molina was seen today for follow-up.  Diagnoses and all orders for this visit:  Bipolar I disorder, moderate, current or most recent episode depressed, in full remission, with mixed features (HCC) -     divalproex (DEPAKOTE ER) 500 MG 24 hr tablet; Take 2 tablets (1,000 mg total) by mouth at bedtime.  Chronic post-traumatic stress disorder -     clonazePAM (KLONOPIN) 0.5 MG tablet; TAKE ONE TABLET BY MOUTH TWICE A DAY AS NEEDED FOR ANXIETY    Please see After Visit Summary for patient specific instructions.  Future Appointments  Date Time Provider Department Center  07/17/2021 12:45 PM Corie Chiquito, PMHNP [facility] None    No orders of the defined types were placed in this encounter.   -------------------------------

## 2021-05-15 ENCOUNTER — Other Ambulatory Visit: Payer: Self-pay | Admitting: Psychiatry

## 2021-05-15 DIAGNOSIS — F4312 Post-traumatic stress disorder, chronic: Secondary | ICD-10-CM

## 2021-05-16 NOTE — Telephone Encounter (Signed)
**Note De-Identified Goffe Obfuscation** Last filled 7/18 appt on 10/24

## 2021-05-26 ENCOUNTER — Ambulatory Visit (INDEPENDENT_AMBULATORY_CARE_PROVIDER_SITE_OTHER): Payer: Commercial Managed Care - PPO | Admitting: Psychiatry

## 2021-05-26 ENCOUNTER — Encounter: Payer: Self-pay | Admitting: Psychiatry

## 2021-05-26 ENCOUNTER — Other Ambulatory Visit: Payer: Self-pay

## 2021-05-26 VITALS — Wt 178.0 lb

## 2021-05-26 DIAGNOSIS — F3176 Bipolar disorder, in full remission, most recent episode depressed: Secondary | ICD-10-CM | POA: Diagnosis not present

## 2021-05-26 DIAGNOSIS — G4700 Insomnia, unspecified: Secondary | ICD-10-CM | POA: Diagnosis not present

## 2021-05-26 MED ORDER — TRAZODONE HCL 100 MG PO TABS: ORAL_TABLET | ORAL | 0 refills | Status: DC

## 2021-05-26 NOTE — Progress Notes (Signed)
**Note De-Identified Bodnar Obfuscation** Syd Kamp [id number] [birth year] 60 y.o.  Subjective:   Patient ID:  Jared Molina is a 60 y.o. (DOB [birth year]) male.  Chief Complaint:  Chief Complaint  Patient presents with   Insomnia   Follow-up    Anxiety, mood disturbance    Insomnia  Jared Molina presents to the office today for follow-up of insomnia, anxiety, and mood disturbance.  He reports that he was rarely taking Klonopin and in the last month he has had to take it about 5 out of 7 days due to sleep disturbance. He reports that he is sleeping ok with Klonopin and Melatonin. Occasionally awakens in the middle of the night and has a cup of hot tea. Now has more difficulty staying asleep instead of falling asleep, which is the opposite of the past. Denies panic or waking up with anxious thoughts. He reports some anxiety in response to work stress. Reports improved work stress now that he is not on a performance improvement plan. He reports that he has been irritable on a few occasions "but it's not much." Reports walking helps with irritability. He reports his energy is ok. Motivation has been somewhat low, especially for things like home improvement projects. Appetite has been good. He reports that he tries to control portion sizes. Adequate concentration. Enjoys taking walks. Denies any impulsive behavior or elevated moods.Thoughts have been somewhat more busy. Denies SI.   He reports that he did not learn until mid-August he was taken off of performance improvement plan at work and then realized how much this had affected him and felt relief with know it had ended.   Walking regularly. Wife continues to help older family members.   Abstaining from alcohol.   Past Psychiatric Medication Trials: Klonopin Zyprexa Risperdal Seroquel Lithium Trazodone    AIMS    Flowsheet Row Office Visit from 01/09/2021 in [facility]  AIMS Total Score 0      PHQ2-9    Flowsheet Row Office Visit from 07/18/2015 in Primary  Care at [facility] Visit from 04/05/2015 in Primary Care at [facility] Total Score 3 0  PHQ-9 Total Score 11 --        Review of Systems:  Review of Systems  Musculoskeletal:  Negative for gait problem.  Neurological:  Negative for tremors.  Psychiatric/Behavioral:  The patient has insomnia.        Please refer to HPI   Medications: I have reviewed the patient's current medications.  Current Outpatient Medications  Medication Sig Dispense Refill   MELATONIN PO Take by mouth.     cholecalciferol (VITAMIN D3) 25 MCG (1000 UNIT) tablet Take 1,000 Units by mouth daily.     clonazePAM (KLONOPIN) 0.5 MG tablet TAKE ONE TABLET BY MOUTH TWICE A DAY AS NEEDED FOR ANXIETY 30 tablet 1   divalproex (DEPAKOTE ER) 500 MG 24 hr tablet Take 2 tablets (1,000 mg total) by mouth at bedtime. 180 tablet 1   Omega-3 Fatty Acids (FISH OIL) 1000 MG CAPS Take by mouth.     simvastatin (ZOCOR) 40 MG tablet TAKE 1 TABLET (40 MG TOTAL) BY MOUTH DAILY. 30 tablet 11   traZODone (DESYREL) 100 MG tablet Take 1/2-1 tablet po QHS prn 90 tablet 0   vitamin B-12 (CYANOCOBALAMIN) 1000 MCG tablet Take 1,000 mcg by mouth daily.     No current facility-administered medications for this visit.    Medication Side Effects: None  Allergies: No Known Allergies  Past Medical History:  Diagnosis Date **Note De-Identified Broadus Obfuscation** Bipolar 1 disorder (HCC)    Depression     Past Medical History, Surgical history, Social history, and Family history were reviewed and updated as appropriate.   Please see review of systems for further details on the patient's review from today.   Objective:   Physical Exam:  Wt 178 lb (80.7 kg)   BMI 24.48 kg/m   Physical Exam Constitutional:      General: He is not in acute distress. Musculoskeletal:        General: No deformity.  Neurological:     Mental Status: He is alert and oriented to person, place, and time.     Coordination: Coordination normal.  Psychiatric:        Attention and  Perception: Attention and perception normal. He does not perceive auditory or visual hallucinations.        Mood and Affect: Mood normal. Mood is not anxious or depressed. Affect is not labile, blunt, angry or inappropriate.        Speech: Speech normal.        Behavior: Behavior normal.        Thought Content: Thought content normal. Thought content is not paranoid or delusional. Thought content does not include homicidal or suicidal ideation. Thought content does not include homicidal or suicidal plan.        Cognition and Memory: Cognition and memory normal.        Judgment: Judgment normal.     Comments: Insight intact    Lab Review:     Component Value Date/Time   NA 134 (L) 08/13/2015 1030   K 4.2 08/13/2015 1030   CL 101 08/13/2015 1030   CO2 25 08/13/2015 1030   GLUCOSE 101 (H) 08/13/2015 1030   BUN 7 08/13/2015 1030   CREATININE 0.88 08/13/2015 1030   CREATININE 0.86 04/05/2015 0908   CALCIUM 9.3 08/13/2015 1030   PROT 6.8 04/05/2015 0908   ALBUMIN 4.3 04/05/2015 0908   AST 16 04/05/2015 0908   ALT 26 04/05/2015 0908   ALKPHOS 46 04/05/2015 0908   BILITOT 0.6 04/05/2015 0908   GFRNONAA >60 08/13/2015 1030   GFRNONAA >89 04/05/2015 0908   GFRAA >60 08/13/2015 1030   GFRAA >89 04/05/2015 0908       Component Value Date/Time   WBC 5.9 08/13/2015 1030   RBC 5.00 08/13/2015 1030   HGB 15.2 08/13/2015 1030   HCT 45.1 08/13/2015 1030   PLT 289 08/13/2015 1030   MCV 90.2 08/13/2015 1030   MCH 30.4 08/13/2015 1030   MCHC 33.7 08/13/2015 1030   RDW 12.6 08/13/2015 1030   LYMPHSABS 0.9 08/13/2015 1030   MONOABS 0.8 08/13/2015 1030   EOSABS 0.0 08/13/2015 1030   BASOSABS 0.0 08/13/2015 1030    No results found for: POCLITH, LITHIUM   Lab Results  Component Value Date   VALPROATE 85 08/17/2015     .res Assessment: Plan:   Pt seen for 30 minutes and time spent discussing treatment options for insomnia. Discussed potential benefits, risks, and side effects of  Trazodone. Will start Trazodone 100 mg 1/2-1 tab po QHS prn insomnia.  Continue Depakote ER 1000 mg po QHS for mood stabilization.  Continue Klonopin prn insomnia and anxiety.  Pt to follow-up in 4-6 weeks or sooner if clinically indicated.  Patient advised to contact office with any questions, adverse effects, or acute worsening in signs and symptoms.  Jared Molina was seen today for insomnia and follow-up.  Diagnoses and all orders for this visit: **Note De-Identified Maestas Obfuscation** Bipolar I disorder, moderate, current or most recent episode depressed, in full remission, with mixed features (HCC)  Insomnia, unspecified type -     traZODone (DESYREL) 100 MG tablet; Take 1/2-1 tablet po QHS prn    Please see After Visit Summary for patient specific instructions.  Future Appointments  Date Time Provider Department Center  07/17/2021 12:45 PM Corie Chiquito, PMHNP [facility] None    No orders of the defined types were placed in this encounter.   -------------------------------

## 2021-06-06 ENCOUNTER — Telehealth: Payer: Self-pay | Admitting: Psychiatry

## 2021-06-06 NOTE — Telephone Encounter (Signed)
**Note De-Identified Grega Obfuscation** Pt called with concerns about Trazodone 100mg . States he started off by taking half and than increased to 100mg . Over the past week he has been having trouble falling asleep and has to take melatonin along with the Trazodone to fall asleep.  He would like to know if it could be increased to 150mg  or 200mg . Pls rtc [phone].

## 2021-06-06 NOTE — Telephone Encounter (Signed)
**Note De-identified Magid Obfuscation** Pt informed

## 2021-06-06 NOTE — Telephone Encounter (Signed)
**Note De-identified Packham Obfuscation** Please review

## 2021-06-12 ENCOUNTER — Telehealth: Payer: Self-pay | Admitting: Psychiatry

## 2021-06-12 DIAGNOSIS — G4700 Insomnia, unspecified: Secondary | ICD-10-CM

## 2021-06-12 MED ORDER — TRAZODONE HCL 100 MG PO TABS: 150.0000 mg | ORAL_TABLET | Freq: Every day | ORAL | 0 refills | Status: DC

## 2021-06-12 NOTE — Telephone Encounter (Signed)
**Note De-Identified Mukherjee Obfuscation** Pt called reporting Trazodone was increased to help with sleep. The increase helps some nights but, not every night.  Next apt 10/24. Pt willing to make apt sooner if need be. Contact  # [phone]

## 2021-06-12 NOTE — Addendum Note (Signed)
**Note De-Identified Eckstein Obfuscation** Addended by: Derenda Mis on: 06/12/2021 05:01 PM   Modules accepted: Orders

## 2021-06-12 NOTE — Telephone Encounter (Signed)
**Note De-Identified Brandenberger Obfuscation** Pt stated the trazodone helps him go to sleep but even with taking 2 a night he still wakes up.He stated he has tried taking one before bed and one when he wakes up in the middle of the night but this is still not helping.

## 2021-06-12 NOTE — Telephone Encounter (Signed)
**Note De-Identified Schermer Obfuscation** Please advise him to increase to 3 tabs po QHS and to call if this is not effective because then he will likely need a change in medication.

## 2021-06-13 NOTE — Telephone Encounter (Signed)
**Note De-identified Salado Obfuscation** Pt informed

## 2021-06-14 ENCOUNTER — Telehealth: Payer: Self-pay | Admitting: Psychiatry

## 2021-06-14 NOTE — Telephone Encounter (Signed)
**Note De-identified Gavidia Obfuscation** Lvm to return call

## 2021-06-14 NOTE — Telephone Encounter (Signed)
**Note De-Identified Geerts Obfuscation** Pt called reporting he increased the dose of Trazodone to 300 mg. Took about 1.5 hrs to fall a sleep and slept  about 4 hours. He mentioned he had taken Clonazepam in the past. Contact Pt @ [phone]. Advise if sooner apt needed. apt 10/24

## 2021-06-15 NOTE — Telephone Encounter (Signed)
**Note De-Identified Camire Obfuscation** Pt will try klonopin tonight and update [place] tommorow

## 2021-06-15 NOTE — Telephone Encounter (Signed)
**Note De-Identified Duchesne Obfuscation** Pt stated he took trazodone 300 mg and woke up at 2am.He then took a clonazepam and that helped him sleep through the night.

## 2021-06-15 NOTE — Telephone Encounter (Signed)
**Note De-Identified Gowens Obfuscation** Is he wanting to continue Klonopin for sleep and stop the Trazodone or is he wanting to try something else for sleep before his next visit?

## 2021-07-17 ENCOUNTER — Ambulatory Visit: Payer: Commercial Managed Care - PPO | Admitting: Psychiatry

## 2021-07-26 ENCOUNTER — Other Ambulatory Visit: Payer: Self-pay | Admitting: Psychiatry

## 2021-07-26 DIAGNOSIS — F4312 Post-traumatic stress disorder, chronic: Secondary | ICD-10-CM

## 2021-07-26 NOTE — Telephone Encounter (Signed)
**Note De-Identified Mcelveen Obfuscation** Last filled 9/22 appt on 11/29

## 2021-08-07 ENCOUNTER — Ambulatory Visit: Payer: Commercial Managed Care - PPO | Admitting: Addiction (Substance Use Disorder)

## 2021-08-14 ENCOUNTER — Ambulatory Visit: Payer: Commercial Managed Care - PPO | Admitting: Addiction (Substance Use Disorder)

## 2021-08-21 ENCOUNTER — Ambulatory Visit: Payer: Commercial Managed Care - PPO | Admitting: Addiction (Substance Use Disorder)

## 2021-08-22 ENCOUNTER — Ambulatory Visit: Payer: Commercial Managed Care - PPO | Admitting: Psychiatry

## 2021-09-08 ENCOUNTER — Other Ambulatory Visit: Payer: Self-pay | Admitting: Psychiatry

## 2021-09-08 DIAGNOSIS — F4312 Post-traumatic stress disorder, chronic: Secondary | ICD-10-CM

## 2021-10-11 ENCOUNTER — Other Ambulatory Visit: Payer: Self-pay

## 2021-10-11 ENCOUNTER — Ambulatory Visit (INDEPENDENT_AMBULATORY_CARE_PROVIDER_SITE_OTHER): Payer: Self-pay | Admitting: Psychiatry

## 2021-10-11 ENCOUNTER — Encounter: Payer: Self-pay | Admitting: Psychiatry

## 2021-10-11 DIAGNOSIS — F4312 Post-traumatic stress disorder, chronic: Secondary | ICD-10-CM

## 2021-10-11 DIAGNOSIS — F3176 Bipolar disorder, in full remission, most recent episode depressed: Secondary | ICD-10-CM

## 2021-10-11 DIAGNOSIS — G4700 Insomnia, unspecified: Secondary | ICD-10-CM

## 2021-10-11 MED ORDER — CLONAZEPAM 0.5 MG PO TABS: ORAL_TABLET | ORAL | 5 refills | Status: DC

## 2021-10-11 MED ORDER — TRAZODONE HCL 100 MG PO TABS: ORAL_TABLET | ORAL | 1 refills | Status: DC

## 2021-10-11 MED ORDER — DIVALPROEX SODIUM ER 500 MG PO TB24: 1000.0000 mg | ORAL_TABLET | Freq: Every day | ORAL | 1 refills | Status: DC

## 2021-10-11 NOTE — Progress Notes (Signed)
**Note De-Identified Jared Molina** Jared Molina [id number] 10-03-1960 61 y.o.  Subjective:   Patient ID:  Jared Molina is a 61 y.o. (DOB 11/12/1960) male.  Chief Complaint:  Chief Complaint  Patient presents with   Follow-up    Anxiety and insomnia    HPI Jared Molina presents to the office today for follow-up of anxiety, mood disturbance, and insomnia. He reports that he is "better than a few months ago."   He reports lost his job last week and "it's been kind of a relief." He had left previous job and was at last job since November. He reports that he has been seeing a counselor at [facility] and this has been helpful. He reports that he has been writing and this has been an outlet for him. He has been enjoying time with his wife.   He reports that the last few nights he has slept without medication. He reports taking Trazodone only about 6 times. He reports there have "been times" of depression. He reports that he had some sadness after losing his job and that his mood has been improving. He reports that he now recognizes he was more anxious in the past than he previously recognized. Anxiety has improved. Energy and motivation have been good. He reports that he has lost weight. Appetite has been ok. Concentration is fair. Denies impulsive or risky behavior. Denies SI.   Recently decreased Klonopin.   Wife is looking for employment and has finished settling her father's estate. He is seeking employment and has a few job leads.   Met with trainer to help certify his dog as a service dog.   Active in AA.   Has started doing some woodworking.   Past Psychiatric Medication Trials: Klonopin Zyprexa Risperdal Seroquel Lithium Trazodone- effective, excessive grogginess  AIMS    Flowsheet Row Office Visit from 01/09/2021 in [place] Total Score 0      PHQ2-9    [place] Office Visit from 07/18/2015 in Primary Care at [place] from 04/05/2015 in Primary Care at [facility] Total Score 3 0  PHQ-9 Total Score 11 --        Review of Systems:  Review of Systems  Cardiovascular:        Occ chest tightness pain that he attributes to anxiety  Gastrointestinal: Negative.   Musculoskeletal:  Negative for gait problem.  Neurological:  Negative for tremors.  Psychiatric/Behavioral:         Please refer to HPI   Medications: I have reviewed the patient's current medications.  Current Outpatient Medications  Medication Sig Dispense Refill   cholecalciferol (VITAMIN D3) 25 MCG (1000 UNIT) tablet Take 1,000 Units by mouth daily.     MELATONIN PO Take by mouth at bedtime as needed.     Omega-3 Fatty Acids (FISH OIL) 1000 MG CAPS Take by mouth.     simvastatin (ZOCOR) 40 MG tablet TAKE 1 TABLET (40 MG TOTAL) BY MOUTH DAILY. 30 tablet 11   vitamin B-12 (CYANOCOBALAMIN) 1000 MCG tablet Take 1,000 mcg by mouth daily.     clonazePAM (KLONOPIN) 0.5 MG tablet TAKE ONE TABLET BY MOUTH TWICE A DAY AS NEEDED FOR ANXIETY 30 tablet 5   divalproex (DEPAKOTE ER) 500 MG 24 hr tablet Take 2 tablets (1,000 mg total) by mouth at bedtime. 180 tablet 1   traZODone (DESYREL) 100 MG tablet Take 1/2-1 tablet po QHS prn 90 tablet 1   No current facility-administered medications for this visit. **Note De-Identified Jared Molina** Medication Side Effects: Other: Excessive somnolence with Trazodone  Allergies: No Known Allergies  Past Medical History:  Diagnosis Date   Bipolar 1 disorder ([place])    Depression     Past Medical History, Surgical history, Social history, and Family history were reviewed and updated as appropriate.   Please see review of systems for further details on the patient's review from today.   Objective:   Physical Exam:  Wt 175 lb (79.4 kg)    BMI 24.07 kg/m   Physical Exam Constitutional:      General: He is not in acute distress. Musculoskeletal:        General: No deformity.  Neurological:     Mental Status: He is alert and oriented to person, place, and time.      Coordination: Coordination normal.  Psychiatric:        Attention and Perception: Attention and perception normal. He does not perceive auditory or visual hallucinations.        Mood and Affect: Mood normal. Mood is not anxious or depressed. Affect is not labile, blunt, angry or inappropriate.        Speech: Speech normal.        Behavior: Behavior normal.        Thought Content: Thought content normal. Thought content is not paranoid or delusional. Thought content does not include homicidal or suicidal ideation. Thought content does not include homicidal or suicidal plan.        Cognition and Memory: Cognition and memory normal.        Judgment: Judgment normal.     Comments: Insight intact    Lab Review:     Component Value Date/Time   NA 134 (L) 08/13/2015 1030   K 4.2 08/13/2015 1030   CL 101 08/13/2015 1030   CO2 25 08/13/2015 1030   GLUCOSE 101 (H) 08/13/2015 1030   BUN 7 08/13/2015 1030   CREATININE 0.88 08/13/2015 1030   CREATININE 0.86 04/05/2015 0908   CALCIUM 9.3 08/13/2015 1030   PROT 6.8 04/05/2015 0908   ALBUMIN 4.3 04/05/2015 0908   AST 16 04/05/2015 0908   ALT 26 04/05/2015 0908   ALKPHOS 46 04/05/2015 0908   BILITOT 0.6 04/05/2015 0908   GFRNONAA >60 08/13/2015 1030   GFRNONAA >89 04/05/2015 0908   GFRAA >60 08/13/2015 1030   GFRAA >89 04/05/2015 0908       Component Value Date/Time   WBC 5.9 08/13/2015 1030   RBC 5.00 08/13/2015 1030   HGB 15.2 08/13/2015 1030   HCT 45.1 08/13/2015 1030   PLT 289 08/13/2015 1030   MCV 90.2 08/13/2015 1030   MCH 30.4 08/13/2015 1030   MCHC 33.7 08/13/2015 1030   RDW 12.6 08/13/2015 1030   LYMPHSABS 0.9 08/13/2015 1030   MONOABS 0.8 08/13/2015 1030   EOSABS 0.0 08/13/2015 1030   BASOSABS 0.0 08/13/2015 1030    No results found for: POCLITH, LITHIUM   Lab Results  Component Value Date   VALPROATE 85 08/17/2015     .res Assessment: Plan:   Will continue current plan of care since target signs and symptoms  are well controlled without any tolerability issues. Will continue Depakote ER 1000 mg at bedtime for mood stabilization. Will continue trazodone 100 mg 1/2 to 1 tablet as needed for insomnia. Continue klonopin 0.5 mg twice daily. Pt to follow-up in 6 months or sooner if clinically indicated.  Patient advised to contact office with any questions, adverse effects, or acute worsening in signs and **Note De-Identified Jared Molina** symptoms.   Jared Molina was seen today for follow-up.  Diagnoses and all orders for this visit:  Bipolar I disorder, moderate, current or most recent episode depressed, in full remission, with mixed features (HCC) -     divalproex (DEPAKOTE ER) 500 MG 24 hr tablet; Take 2 tablets (1,000 mg total) by mouth at bedtime.  Chronic post-traumatic stress disorder -     clonazePAM (KLONOPIN) 0.5 MG tablet; TAKE ONE TABLET BY MOUTH TWICE A DAY AS NEEDED FOR ANXIETY  Insomnia, unspecified type -     traZODone (DESYREL) 100 MG tablet; Take 1/2-1 tablet po QHS prn     Please see After Visit Summary for patient specific instructions.  Future Appointments  Date Time Provider [place]  04/09/2022 12:45 PM Thayer Headings, PMHNP [facility] None    No orders of the defined types were placed in this encounter.   -------------------------------

## 2022-04-09 ENCOUNTER — Ambulatory Visit: Payer: Self-pay | Admitting: Psychiatry

## 2022-04-11 ENCOUNTER — Ambulatory Visit: Payer: Self-pay | Admitting: Psychiatry

## 2022-04-25 ENCOUNTER — Ambulatory Visit: Payer: No Typology Code available for payment source | Admitting: Psychiatry

## 2022-04-25 ENCOUNTER — Encounter: Payer: Self-pay | Admitting: Psychiatry

## 2022-04-25 ENCOUNTER — Ambulatory Visit: Payer: Self-pay | Admitting: Psychiatry

## 2022-04-25 DIAGNOSIS — F3176 Bipolar disorder, in full remission, most recent episode depressed: Secondary | ICD-10-CM

## 2022-04-25 DIAGNOSIS — F4312 Post-traumatic stress disorder, chronic: Secondary | ICD-10-CM

## 2022-04-25 MED ORDER — DIVALPROEX SODIUM ER 500 MG PO TB24: 1000.0000 mg | ORAL_TABLET | Freq: Every day | ORAL | 1 refills | Status: DC

## 2022-04-25 MED ORDER — CLONAZEPAM 0.5 MG PO TABS: ORAL_TABLET | ORAL | 3 refills | Status: DC

## 2022-04-25 NOTE — Progress Notes (Signed)
**Note De-Identified Sear Obfuscation** Jared Molina [id number] 27-Aug-1961 61 y.o.  Subjective:   Patient ID:  Jared Molina is a 61 y.o. (DOB 1960-10-19) male.  Chief Complaint:  Chief Complaint  Patient presents with   Follow-up    Anxiety, mood disturbance, and insomnia    HPI Jared Molina presents to the office today for follow-up of anxiety, mood disturbance, and insomnia. Jared Molina reports that being out of work was difficult at first. Jared Molina then started working some at [employer]. Jared Molina found another job and worked their briefly and it "was not good." Jared Molina has been interviewing for other jobs and is learning more about what Jared Molina would like to do. Jared Molina reports that job search process is frustrating.   Jared Molina reports that Jared Molina feels nervous when people are in his personal space or when Jared Molina is tired. Jared Molina reports that Jared Molina often does not recognize when Jared Molina is anxious and sometimes other people notice before Jared Molina does. Jared Molina reports rare episodes of irritation. Jared Molina reports that Jared Molina e some impulsivity a couple of weeks ago to complete a task. Denies any recent excessive spending.    Jared Molina reports that Jared Molina has had some depression and is working on Physicist, medical. Jared Molina reports walking is helpful for his back. Energy and motivation have been good. Concentration has been fair. Sleeping 4-6 hours a night. Denies SI.   Jared Molina has continued to see a therapist. Jared Molina has been working with Armed forces operational officer to have a Administrator, Civil Service. Has been taking dog to multiple locations. Reports that dog is helpful for anxiety. Has been journaling.   Jared Molina and his wife are managing their finances together. Has been attending church more. 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   Klonopin last filled 03/02/22 x3  Past Psychiatric Medication Trials: Klonopin Zyprexa Risperdal Seroquel Lithium Trazodone- effective, excessive grogginess    AIMS    Flowsheet Row Office Visit from 01/09/2021 in [facility]  AIMS Total Score 0      PHQ2-9    Flowsheet Row Office Visit from 07/18/2015 in Primary Care  at [facility] Visit from 04/05/2015 in Primary Care at [facility] Total Score 3 0  PHQ-9 Total Score 11 --        Review of Systems:  Review of Systems  HENT:  Positive for hearing loss.   Musculoskeletal:  Positive for back pain. Negative for gait problem.  Neurological:  Negative for tremors.  Psychiatric/Behavioral:         Please refer to HPI    Medications: I have reviewed the patient's current medications.  Current Outpatient Medications  Medication Sig Dispense Refill   traZODone (DESYREL) 100 MG tablet Take 1/2-1 tablet po QHS prn 90 tablet 1   cholecalciferol (VITAMIN D3) 25 MCG (1000 UNIT) tablet Take 1,000 Units by mouth daily.     clonazePAM (KLONOPIN) 0.5 MG tablet TAKE ONE TABLET BY MOUTH TWICE A DAY AS NEEDED FOR ANXIETY 30 tablet 3   divalproex (DEPAKOTE ER) 500 MG 24 hr tablet Take 2 tablets (1,000 mg total) by mouth at bedtime. 180 tablet 1   MELATONIN PO Take by mouth at bedtime as needed.     Omega-3 Fatty Acids (FISH OIL) 1000 MG CAPS Take by mouth.     simvastatin (ZOCOR) 40 MG tablet TAKE 1 TABLET (40 MG TOTAL) BY MOUTH DAILY. (Patient not taking: Reported on 04/25/2022) 30 tablet 11   vitamin B-12 (CYANOCOBALAMIN) 1000 MCG tablet Take 1,000 mcg by mouth daily.     No current facility-administered medications **Note De-Identified Duey Obfuscation** for this visit.    Medication Side Effects: None  Allergies: No Known Allergies  Past Medical History:  Diagnosis Date   Bipolar 1 disorder (HCC)    Depression     Past Medical History, Surgical history, Social history, and Family history were reviewed and updated as appropriate.   Please see review of systems for further details on the patient's review from today.   Objective:   Physical Exam:  There were no vitals taken for this visit.  Physical Exam Constitutional:      General: Jared Molina is not in acute distress. Musculoskeletal:        General: No deformity.  Neurological:     Mental Status: Jared Molina is alert and oriented to person,  place, and time.     Coordination: Coordination normal.  Psychiatric:        Attention and Perception: Attention and perception normal. Jared Molina does not perceive auditory or visual hallucinations.        Mood and Affect: Mood normal. Mood is not anxious or depressed. Affect is not labile, blunt, angry or inappropriate.        Speech: Speech normal.        Behavior: Behavior normal.        Thought Content: Thought content normal. Thought content is not paranoid or delusional. Thought content does not include homicidal or suicidal ideation. Thought content does not include homicidal or suicidal plan.        Cognition and Memory: Cognition and memory normal.        Judgment: Judgment normal.     Comments: Insight intact     Lab Review:     Component Value Date/Time   NA 134 (L) 08/13/2015 1030   K 4.2 08/13/2015 1030   CL 101 08/13/2015 1030   CO2 25 08/13/2015 1030   GLUCOSE 101 (H) 08/13/2015 1030   BUN 7 08/13/2015 1030   CREATININE 0.88 08/13/2015 1030   CREATININE 0.86 04/05/2015 0908   CALCIUM 9.3 08/13/2015 1030   PROT 6.8 04/05/2015 0908   ALBUMIN 4.3 04/05/2015 0908   AST 16 04/05/2015 0908   ALT 26 04/05/2015 0908   ALKPHOS 46 04/05/2015 0908   BILITOT 0.6 04/05/2015 0908   GFRNONAA >60 08/13/2015 1030   GFRNONAA >89 04/05/2015 0908   GFRAA >60 08/13/2015 1030   GFRAA >89 04/05/2015 0908       Component Value Date/Time   WBC 5.9 08/13/2015 1030   RBC 5.00 08/13/2015 1030   HGB 15.2 08/13/2015 1030   HCT 45.1 08/13/2015 1030   PLT 289 08/13/2015 1030   MCV 90.2 08/13/2015 1030   MCH 30.4 08/13/2015 1030   MCHC 33.7 08/13/2015 1030   RDW 12.6 08/13/2015 1030   LYMPHSABS 0.9 08/13/2015 1030   MONOABS 0.8 08/13/2015 1030   EOSABS 0.0 08/13/2015 1030   BASOSABS 0.0 08/13/2015 1030    No results found for: "POCLITH", "LITHIUM"   Lab Results  Component Value Date   VALPROATE 85 08/17/2015     .res Assessment: Plan:   Patient seen for 30 minutes and time  spent discussing changes in work and social history since last visit.  Jared Molina reports that Jared Molina has been searching for jobs and has been working other jobs in the meantime and focusing on therapy and his well being.  Jared Molina reports that his dog has almost completed training as a service animal and that Jared Molina will be receiving certification/documentation to this effect in the near future.  Jared Molina reports that **Note De-Identified Blacketer Obfuscation** Jared Molina also needed assistance from this provider with certification process.  Recommended that Jared Molina drop off documentation from service animal trainer and any forms or specific requests for required documentation when this is available. Will continue current plan of care since target signs and symptoms are well controlled without any tolerability issues. Will continue Depakote ER 1000 mg at bedtime for mood stabilization.  Recommended lab monitoring to evaluate for any possible adverse effects with Depakote ER.  Jared Molina reports that Jared Molina has and annual physical exam in 2-3 weeks.  Requested that Jared Molina lab results to this office or signed information release for PCP to forward lab results to this office. Continue Klonopin 0.5 mg twice daily as needed for anxiety. Pt to follow-up in 6 months or sooner if clinically indicated.  Patient advised to contact office with any questions, adverse effects, or acute worsening in signs and symptoms.   Jared Molina was seen today for follow-up.  Diagnoses and all orders for this visit:  Chronic post-traumatic stress disorder -     clonazePAM (KLONOPIN) 0.5 MG tablet; TAKE ONE TABLET BY MOUTH TWICE A DAY AS NEEDED FOR ANXIETY  Bipolar I disorder, moderate, current or most recent episode depressed, in full remission, with mixed features (HCC) -     divalproex (DEPAKOTE ER) 500 MG 24 hr tablet; Take 2 tablets (1,000 mg total) by mouth at bedtime.     Please see After Visit Summary for patient specific instructions.  Future Appointments  Date Time Provider Department Center  10/29/2022 12:45 PM  Corie Chiquito, PMHNP [facility] None    No orders of the defined types were placed in this encounter.   -------------------------------

## 2022-05-11 ENCOUNTER — Telehealth: Payer: Self-pay | Admitting: Psychiatry

## 2022-05-11 NOTE — Telephone Encounter (Signed)
**Note De-Identified Leifheit Obfuscation** Patient called regarding his Valproic Acid levels. States he went to get labs done and his levels were really low. He would like to speak with JC about this. Says that he tried to send message through Mychart but it wouldn't go through. JC can reply there if easier. Ph: [phone]

## 2022-05-11 NOTE — Telephone Encounter (Signed)
**Note De-identified Biscardi Obfuscation** Please review

## 2022-05-11 NOTE — Telephone Encounter (Signed)
**Note De-Identified Fullenwider Obfuscation** Returned call to patient about VPA levels. Most recent VPA was 10. He reports that he took his Depakote ER the night before, probably around midnight. He denies any breakthrough mood symptoms- "if anything I am better." He reports that he misses Depakote on rare occasions and then may have trouble sleeping. He reports that he now falls asleep without difficulty. He reports that he has not taken Klonopin or Trazodone prn "in months." He reports that he has been using non-pharmacological techniques to help manage symptoms. He reports that he was off meds in 1996-2001 and this led to ETOH use.   Discussed VPA levels. He reports that he would like to continue current dose of medication and use non-pharmacological techniques. Will continue current dose of Depakote ER and monitor closely for possible manic s/s or worsening mood s/s. He reports he will call office if he has any new symptoms or changes in mood and that he will also ask his wife to let him know if she observes any changes in his mood or behavior. Patient advised to contact office with any questions, adverse effects, or acute worsening in signs and symptoms.

## 2022-10-29 ENCOUNTER — Ambulatory Visit: Payer: No Typology Code available for payment source | Admitting: Psychiatry

## 2022-11-19 ENCOUNTER — Ambulatory Visit: Payer: No Typology Code available for payment source | Admitting: Psychiatry

## 2022-11-26 ENCOUNTER — Ambulatory Visit (INDEPENDENT_AMBULATORY_CARE_PROVIDER_SITE_OTHER): Payer: 59 | Admitting: Psychiatry

## 2022-11-26 ENCOUNTER — Encounter: Payer: Self-pay | Admitting: Psychiatry

## 2022-11-26 DIAGNOSIS — F3175 Bipolar disorder, in partial remission, most recent episode depressed: Secondary | ICD-10-CM

## 2022-11-26 DIAGNOSIS — G4700 Insomnia, unspecified: Secondary | ICD-10-CM | POA: Diagnosis not present

## 2022-11-26 MED ORDER — DIVALPROEX SODIUM ER 500 MG PO TB24: 1000.0000 mg | ORAL_TABLET | Freq: Every day | ORAL | 1 refills | Status: DC

## 2022-11-26 NOTE — Progress Notes (Signed)
**Note De-Identified Thom Obfuscation** Tyreq [place]burn [id number] 1961-07-30 62 y.o.  Subjective:   Patient ID:  Jared Molina is a 62 y.o. (DOB 1961/08/18) male.  Chief Complaint:  Chief Complaint  Patient presents with   Follow-up    Anxiety and mood disturbance    HPI Jared Molina presents to the office today for follow-up of anxiety, mood disturbance, and insomnia.   He has been working at [employer] and worked full-time and then transitioned back to [employer]. he reports that his wife has been having back issues and had surgery Friday after seeing multiple specialists. He is helping wife with her recovery since she will be at home for 6 weeks.   He reports that he tapered off Depakote and "it didn't bother me." He reports that he notices some headaches more when not taking Depakote.  He reports "I have no problem going to sleep" now. He reports that he will occasionally have middle of the night or early morning awakenings.   He has had some mild depression in response to looking for a job. "I am grateful for getting up a being part of the day." Energy and motivation have been ok. He reports that anxiety has been "not bad" and is relieved that wife's surgery went well. He reports that he has "probably" had some brief elevations in mood. Concentration has been ok. Appetite has been good. Enjoys cooking. Denies diminished interest in things. Denies SI.   He has been in counseling for the past year and finds this helpful. Dog has been helpful for his mood and anxiety.    Past Psychiatric Medication Trials: Klonopin Zyprexa Risperdal Seroquel Lithium Trazodone- effective, excessive grogginess  AIMS    Flowsheet Row Office Visit from 01/09/2021 in [facility] Total Score 0      PHQ2-9    [place] Office Visit from 07/18/2015 in Primary Care at [facility] from 04/05/2015 in Primary Care at [facility] Total Score 3 0  PHQ-9 Total Score 11 --        Review of Systems:  Review of  Systems  Gastrointestinal: Negative.   Musculoskeletal:  Positive for back pain. Negative for gait problem.  Neurological:  Negative for tremors and headaches.  Psychiatric/Behavioral:         Please refer to HPI    Medications: I have reviewed the patient's current medications.  Current Outpatient Medications  Medication Sig Dispense Refill   MELATONIN PO Take by mouth at bedtime as needed.     simvastatin (ZOCOR) 40 MG tablet TAKE 1 TABLET (40 MG TOTAL) BY MOUTH DAILY. 30 tablet 11   divalproex (DEPAKOTE ER) 500 MG 24 hr tablet Take 2 tablets (1,000 mg total) by mouth at bedtime. 180 tablet 1   No current facility-administered medications for this visit.    Medication Side Effects: None  Allergies: No Known Allergies  Past Medical History:  Diagnosis Date   Bipolar 1 disorder ([place])    Depression     Past Medical History, Surgical history, Social history, and Family history were reviewed and updated as appropriate.   Please see review of systems for further details on the patient's review from today.   Objective:   Physical Exam:  There were no vitals taken for this visit.  Physical Exam Constitutional:      General: He is not in acute distress. Musculoskeletal:        General: No deformity.  Neurological:     Mental Status: He is **Note De-Identified Jolin Obfuscation** alert and oriented to person, place, and time.     Coordination: Coordination normal.  Psychiatric:        Attention and Perception: Attention and perception normal. He does not perceive auditory or visual hallucinations.        Mood and Affect: Mood normal. Mood is not anxious or depressed. Affect is not labile, blunt, angry or inappropriate.        Speech: Speech normal.        Behavior: Behavior normal.        Thought Content: Thought content normal. Thought content is not paranoid or delusional. Thought content does not include homicidal or suicidal ideation. Thought content does not include homicidal or suicidal plan.         Cognition and Memory: Cognition and memory normal.        Judgment: Judgment normal.     Comments: Insight intact     Lab Review:     Component Value Date/Time   NA 134 (L) 08/13/2015 1030   K 4.2 08/13/2015 1030   CL 101 08/13/2015 1030   CO2 25 08/13/2015 1030   GLUCOSE 101 (H) 08/13/2015 1030   BUN 7 08/13/2015 1030   CREATININE 0.88 08/13/2015 1030   CREATININE 0.86 04/05/2015 0908   CALCIUM 9.3 08/13/2015 1030   PROT 6.8 04/05/2015 0908   ALBUMIN 4.3 04/05/2015 0908   AST 16 04/05/2015 0908   ALT 26 04/05/2015 0908   ALKPHOS 46 04/05/2015 0908   BILITOT 0.6 04/05/2015 0908   GFRNONAA >60 08/13/2015 1030   GFRNONAA >89 04/05/2015 0908   GFRAA >60 08/13/2015 1030   GFRAA >89 04/05/2015 0908       Component Value Date/Time   WBC 5.9 08/13/2015 1030   RBC 5.00 08/13/2015 1030   HGB 15.2 08/13/2015 1030   HCT 45.1 08/13/2015 1030   PLT 289 08/13/2015 1030   MCV 90.2 08/13/2015 1030   MCH 30.4 08/13/2015 1030   MCHC 33.7 08/13/2015 1030   RDW 12.6 08/13/2015 1030   LYMPHSABS 0.9 08/13/2015 1030   MONOABS 0.8 08/13/2015 1030   EOSABS 0.0 08/13/2015 1030   BASOSABS 0.0 08/13/2015 1030    No results found for: "POCLITH", "LITHIUM"   Lab Results  Component Value Date   VALPROATE 85 08/17/2015     .res Assessment: Plan:   Labs reviewed from PCP, Dr. Coletta Memos, with [facility].  Liver enzymes were within normal limits and no adverse effects with Depakote noted. Will continue Depakote ER 1000 mg at bedtime for mood stabilization. Patient reports that he is no longer needing to use clonazepam or trazodone for insomnia. Pt to follow-up in 6 months or sooner if clinically indicated.  Patient advised to contact office with any questions, adverse effects, or acute worsening in signs and symptoms.  Jared Molina was seen today for follow-up.  Diagnoses and all orders for this visit:  Bipolar disorder, in partial remission, most recent episode depressed (HCC) -      divalproex (DEPAKOTE ER) 500 MG 24 hr tablet; Take 2 tablets (1,000 mg total) by mouth at bedtime.  Insomnia, unspecified type     Please see After Visit Summary for patient specific instructions.  Future Appointments  Date Time Provider [place]  05/29/2023 10:30 AM Thayer Headings, PMHNP CP-[facility] None    No orders of the defined types were placed in this encounter.   -------------------------------

## 2023-02-08 ENCOUNTER — Other Ambulatory Visit: Payer: Self-pay | Admitting: Psychiatry

## 2023-02-08 DIAGNOSIS — F4312 Post-traumatic stress disorder, chronic: Secondary | ICD-10-CM

## 2023-02-10 NOTE — Telephone Encounter (Signed)
**Note De-Identified Demilio Obfuscation** From 11/26/22 note:  Patient reports that he is no longer needing to use clonazepam. ?

## 2023-02-12 NOTE — Telephone Encounter (Signed)
**Note De-identified Skufca Obfuscation** LVM to RC 

## 2023-05-29 ENCOUNTER — Ambulatory Visit: Payer: 59 | Admitting: Psychiatry

## 2023-07-19 ENCOUNTER — Other Ambulatory Visit: Payer: Self-pay | Admitting: Psychiatry

## 2023-07-19 DIAGNOSIS — F3175 Bipolar disorder, in partial remission, most recent episode depressed: Secondary | ICD-10-CM

## 2023-07-21 NOTE — Telephone Encounter (Signed)
**Note De-Identified Talcott Obfuscation** Please call to schedule FU, past due and canceled FU appt.

## 2023-07-22 NOTE — Telephone Encounter (Signed)
Lvm for pt to call and schedule

## 2023-08-07 ENCOUNTER — Encounter: Payer: Self-pay | Admitting: Psychiatry

## 2023-08-18 ENCOUNTER — Other Ambulatory Visit: Payer: Self-pay | Admitting: Psychiatry

## 2023-08-18 DIAGNOSIS — F3175 Bipolar disorder, in partial remission, most recent episode depressed: Secondary | ICD-10-CM

## 2023-08-19 NOTE — Telephone Encounter (Signed)
**Note De-Identified Kithcart Obfuscation** Sent MyChart message to schedule FU.

## 2023-08-26 NOTE — Telephone Encounter (Signed)
Please call to schedule followup, did not respond to MyChart message.

## 2023-08-30 NOTE — Telephone Encounter (Signed)
 Appt 12/31

## 2023-09-24 ENCOUNTER — Telehealth (INDEPENDENT_AMBULATORY_CARE_PROVIDER_SITE_OTHER): Payer: 59 | Admitting: Psychiatry

## 2023-09-24 ENCOUNTER — Encounter: Payer: Self-pay | Admitting: Psychiatry

## 2023-09-24 DIAGNOSIS — F3175 Bipolar disorder, in partial remission, most recent episode depressed: Secondary | ICD-10-CM | POA: Diagnosis not present

## 2023-09-24 DIAGNOSIS — G4700 Insomnia, unspecified: Secondary | ICD-10-CM | POA: Diagnosis not present

## 2023-09-24 DIAGNOSIS — Z79899 Other long term (current) drug therapy: Secondary | ICD-10-CM

## 2023-09-24 MED ORDER — DIVALPROEX SODIUM ER 500 MG PO TB24: 1000.0000 mg | ORAL_TABLET | Freq: Every day | ORAL | 1 refills | Status: AC

## 2023-09-24 NOTE — Progress Notes (Signed)
**Note De-Identified Norment Obfuscation** Jared Molina [id number] 08-24-1961 62 y.o.  Virtual Visit [place] Video Note  I connected with pt @ on 09/24/23 at  9:00 AM EST by a video enabled telemedicine application and verified that I am speaking with the correct person using two identifiers.   I discussed the limitations of evaluation and management by telemedicine and the availability of in person appointments. The patient expressed understanding and agreed to proceed.  I discussed the assessment and treatment plan with the patient. The patient was provided an opportunity to ask questions and all were answered. The patient agreed with the plan and demonstrated an understanding of the instructions.   The patient was advised to call back or seek an in-person evaluation if the symptoms worsen or if the condition fails to improve as anticipated.  I provided 17 minutes of non-face-to-face time during this encounter.  The patient was located at home.  The provider was located at home.   Jared Molina, PMHNP   Subjective:   Patient ID:  Jared Molina is a 62 y.o. (DOB 09/13/1961) male.  Chief Complaint:  Chief Complaint  Patient presents with   Follow-up    Anxiety, mood disturbance, and insomnia    HPI Jared Molina presents for follow-up of mood disturbance, anxiety, and insomnia. Denies any difficulty with sleep. Rarely needing to take Melatonin. He reports that his depression is better than it used to be. He reports that his mood has been more stable. He reports that depression has been less. He reports that he has had minimal manic symptoms. He reports some financial stressors. He reports that anxiety has been more manageable. Rarely taking Klonopin . He reports energy and motivation are ok. Denies impaired concentration. He reports that he is better able to focus compared to the past. Appetite has been ok. Denies SI.   He is now working for [employer].   He works Friday-Monday.   His dog has had some health issues and seems  to be improving. Mother had some health issues in the summer and she is doing better.   Klonopin  last filled 07/04/23.   Past Psychiatric Medication Trials: Klonopin  Zyprexa  Risperdal  Seroquel Lithium Trazodone - effective, excessive grogginess  Review of Systems:  Review of Systems  Gastrointestinal:        Abdominal pain after eating certain foods  Musculoskeletal:  Negative for gait problem.       Improved back pain with stretching and exercises.   Neurological:  Negative for tremors.  Psychiatric/Behavioral:         Please refer to HPI    Medications: I have reviewed the patient's current medications.  Current Outpatient Medications  Medication Sig Dispense Refill   simvastatin  (ZOCOR ) 40 MG tablet TAKE 1 TABLET (40 MG TOTAL) BY MOUTH DAILY. 30 tablet 11   clonazePAM  (KLONOPIN ) 0.5 MG tablet TAKE ONE TABLET BY MOUTH TWICE A DAY AS NEEDED FOR ANXIETY (Patient not taking: Reported on 09/24/2023) 30 tablet 1   divalproex  (DEPAKOTE  ER) 500 MG 24 hr tablet Take 2 tablets (1,000 mg total) by mouth daily. 180 tablet 1   MELATONIN PO Take by mouth at bedtime as needed. (Patient not taking: Reported on 09/24/2023)     No current facility-administered medications for this visit.    Medication Side Effects: None  Allergies: No Known Allergies  Past Medical History:  Diagnosis Date   Bipolar 1 disorder (HCC)    Depression     Family History  Problem Relation Age of Onset   Heart disease **Note De-Identified Cislo Obfuscation** Father    Hyperlipidemia Father    Bipolar disorder Maternal Grandmother    Heart disease Maternal Grandfather    Hyperlipidemia Maternal Grandfather    Cancer Paternal Grandmother        lung cancer    Social History   Socioeconomic History   Marital status: Married    Spouse name: Not on file   Number of children: Not on file   Years of education: Not on file   Highest education level: Not on file  Occupational History   Not on file  Tobacco Use   Smoking status: Former     Current packs/day: 0.00    Types: Cigarettes    Quit date: 11/24/2013    Years since quitting: 9.8   Smokeless tobacco: Never  Vaping Use   Vaping status: Never Used  Substance and Sexual Activity   Alcohol use: Not Currently    Comment: quit drinking in Sept 2016   Drug use: Never   Sexual activity: Yes  Other Topics Concern   Not on file  Social History Narrative   Married.   Social Drivers of Corporate Investment Banker Strain: High Risk (05/20/2023)   Received from [facility]   Overall Financial Resource Strain (CARDIA)    Difficulty of Paying Living Expenses: Hard  Food Insecurity: No Food Insecurity (05/20/2023)   Received from [facility]   Hunger Vital Sign    Worried About Running Out of Food in the Last Year: Never true    Ran Out of Food in the Last Year: Never true  Transportation Needs: No Transportation Needs (05/20/2023)   Received [facility] - Transportation    Lack of Transportation (Medical): No    Lack of Transportation (Non-Medical): No  Physical Activity: Sufficiently Active (05/20/2023)   [facility] from [facility]   Exercise Vital Sign    Days of Exercise per Week: 5 days    Minutes of Exercise per Session: 40 min  Stress: No Stress Concern Present (05/20/2023)   Received from [facility] - Occupational Stress Questionnaire    Feeling of Stress : Only a little  Social Connections: Somewhat Isolated (05/20/2023)   Received from [facility]   Social Network    How would you rate your social network (family, work, friends)?: Restricted participation with some degree of social isolation  Intimate Partner Violence: Not At Risk (05/20/2023)   Received from [facility]   HITS    Over the last 12 months how often did your partner physically hurt you?: Never    Over the last 12 months how often did your partner insult you or talk down to you?: Rarely    Over the last 12 months how often  did your partner threaten you with physical harm?: Never    Over the last 12 months how often did your partner scream or curse at you?: Rarely    Past Medical History, Surgical history, Social history, and Family history were reviewed and updated as appropriate.   Please see review of systems for further details on the patient's review from today.   Objective:   Physical Exam:  There were no vitals taken for this visit.  Physical Exam Neurological:     Mental Status: He is alert and oriented to person, place, and time.     Cranial Nerves: No dysarthria.  Psychiatric:        Attention and Perception: Attention and perception **Note De-Identified Mcgue Obfuscation** normal.        Mood and Affect: Mood normal.        Speech: Speech normal.        Behavior: Behavior is cooperative.        Thought Content: Thought content normal. Thought content is not paranoid or delusional. Thought content does not include homicidal or suicidal ideation. Thought content does not include homicidal or suicidal plan.        Cognition and Memory: Cognition and memory normal.        Judgment: Judgment normal.     Comments: Insight intact     Lab Review:     Component Value Date/Time   NA 134 (L) 08/13/2015 1030   K 4.2 08/13/2015 1030   CL 101 08/13/2015 1030   CO2 25 08/13/2015 1030   GLUCOSE 101 (H) 08/13/2015 1030   BUN 7 08/13/2015 1030   CREATININE 0.88 08/13/2015 1030   CREATININE 0.86 04/05/2015 0908   CALCIUM 9.3 08/13/2015 1030   PROT 6.8 04/05/2015 0908   ALBUMIN 4.3 04/05/2015 0908   AST 16 04/05/2015 0908   ALT 26 04/05/2015 0908   ALKPHOS 46 04/05/2015 0908   BILITOT 0.6 04/05/2015 0908   GFRNONAA >60 08/13/2015 1030   GFRNONAA >89 04/05/2015 0908   GFRAA >60 08/13/2015 1030   GFRAA >89 04/05/2015 0908       Component Value Date/Time   WBC 5.9 08/13/2015 1030   RBC 5.00 08/13/2015 1030   HGB 15.2 08/13/2015 1030   HCT 45.1 08/13/2015 1030   PLT 289 08/13/2015 1030   MCV 90.2 08/13/2015 1030   MCH 30.4  08/13/2015 1030   MCHC 33.7 08/13/2015 1030   RDW 12.6 08/13/2015 1030   LYMPHSABS 0.9 08/13/2015 1030   MONOABS 0.8 08/13/2015 1030   EOSABS 0.0 08/13/2015 1030   BASOSABS 0.0 08/13/2015 1030    No results found for: POCLITH, LITHIUM   Lab Results  Component Value Date   VALPROATE 85 08/17/2015     .res Assessment: Plan:    Will check Valproic Acid  Level and CBC since WBC, Hemoglobin, and Hematocrit were low on 05/21/23. Discussed that Depakote  can cause changes in blood count and recommended re-checking labs to determine if lab values have changed.  Continue Depakote  ER 1000 mg at bedtime for mood stabilization.  He reports that he is no longer taking Klonopin  and does not need a refill of Klonopin  at this time.  Pt to follow-up in 6 months or sooner if clinically indicated.  Patient advised to contact office with any questions, adverse effects, or acute worsening in signs and symptoms.   Nayan was seen today for follow-up.  Diagnoses and all orders for this visit:  Bipolar disorder, in partial remission, most recent episode depressed (HCC) -     divalproex  (DEPAKOTE  ER) 500 MG 24 hr tablet; Take 2 tablets (1,000 mg total) by mouth daily.  High risk medication use -     CBC -     Valproic acid  level  Insomnia, unspecified type     Please see After Visit Summary for patient specific instructions.  No future appointments.   Orders Placed This Encounter  Procedures   CBC   Valproic acid  level      -------------------------------

## 2023-09-27 ENCOUNTER — Other Ambulatory Visit: Payer: Self-pay | Admitting: Psychiatry

## 2023-09-27 ENCOUNTER — Encounter: Payer: Self-pay | Admitting: Psychiatry

## 2023-09-27 DIAGNOSIS — F3175 Bipolar disorder, in partial remission, most recent episode depressed: Secondary | ICD-10-CM

## 2023-09-27 LAB — CBC
HCT: 43.5 % (ref 38.5–50.0)
Hemoglobin: 14.2 g/dL (ref 13.2–17.1)
MCH: 29.5 pg (ref 27.0–33.0)
MCHC: 32.6 g/dL (ref 32.0–36.0)
MCV: 90.2 fL (ref 80.0–100.0)
MPV: 11.1 fL (ref 7.5–12.5)
Platelets: 269 10*3/uL (ref 140–400)
RBC: 4.82 10*6/uL (ref 4.20–5.80)
RDW: 13.1 % (ref 11.0–15.0)
WBC: 5.8 10*3/uL (ref 3.8–10.8)

## 2023-09-27 LAB — VALPROIC ACID LEVEL: Valproic Acid Lvl: 28.7 mg/L — ABNORMAL LOW (ref 50.0–100.0)

## 2024-08-10 ENCOUNTER — Telehealth: Payer: Self-pay | Admitting: Diagnostic Neuroimaging

## 2024-08-10 NOTE — Telephone Encounter (Signed)
**Note De-Identified Miranda Obfuscation** Received sleep referral from [facility] Dr. Alm Bilis for suspected sleep apnea. Placed in sleep referrals box

## 2024-08-25 NOTE — Progress Notes (Signed)
**Note De-Identified [place] Obfuscation** Referring Provider:  Alm FORBES Bilis, MD   Chief Complaint:   Hearing loss  Subjective:     Jared Molina is a 63 y.o. male who is new to my clinic and referred by Alm FORBES Bilis, MD for the evaluation of hearing loss. Reports a history of cholesteatoma as a child for which he underwent what is described as a tympanomastoidectomy. Multiple attempts at [facility] were made, the most recent of which was in 2005, however, none of these were successful. Since 2020, he has worn hearing aids, which were beneficial until earlier this year at which point he ceased not note a difference in his hearing in his right ear with or without his hearing aid in. He has not had any drainage from his right ear since his tympanomastoidectomy. Reports a sudden hearing loss a few years ago, however, no recent changes in hearing. He does not feel that he can valsalva. Has been evaluated by multiple ENTs in [place] and [place] with recent recommendation for a bone conduction aid, however, he was not in network with this doctor prompting referral here.   Interval follow up (08/24/24): History of Present Illness Jared Molina is a 63 year old male with chronic ear disease who presents with hearing loss and consideration of bone conduction hearing devices.  He has a long-standing history of hearing loss, which has been a significant source of frustration. He experiences constant tinnitus and difficulty hearing, which has greatly impacted his daily life. Recently, he trialed a bone conduction hearing device with an audiologist and found it significantly beneficial, describing the improvement as 'mind blowing'.  His medical history includes chronic ear disease, recurrent cholesteatoma, and eustachian tube dysfunction since childhood. He had his first ear tubes placed at age 74 and has undergone multiple ear surgeries, including procedures to address cholesteatoma and mastoid cavity obliteration with bone dust to aid in regrowth and balance.  He is  currently considering different bone conduction hearing devices, including options with and without Bluetooth capability. 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 He is also considering the physical aspects of the devices, such as whether they involve a post or a magnet under the skin.  He is currently covered by [facility] but will transition to Medicare in January, which he anticipates will provide better coverage for the hearing device.  He had an eval with Dr. Olita today and really enjoyed the trial headband of the bone conduction device. Sentio and [employer] were given.    The history was obtained by review of the patient's medical records and from the patient.   Past Medical History, Surgical History, Medications, Allergies, Social History:  Past medical, surgical, social history including medications and allergies are reviewed.   Review of Systems A full review of systems was obtained from the patient and is documented in the Nursing Progress note. I have reviewed this information and discussed as appropriate with the patient; all other systems are negative.  Radiology:   I reviewed the CT temporal bone from 08/24/24 - shows bilateral sclerotic mastoids, on R evidence of ? Partial bone cement obliteration of mastoid? Does not appear there was an actual mastoidectomy. PORP in place in good position at OW membrane but not contacting TM.     Objective:   Audiometric Data: An audiogram was performed on 06/17/24 and independently interpreted by myself: right - severe mixed hearing loss with essentially maximal conductive component. Mild to moderate sensorineural loss. Flat tymp **Note De-Identified [place]de Obfuscation** with mildly enlarged EAC volume. 86% WRS. 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    Vital Signs:   BP 125/79 (BP Location: Right upper arm, Patient Position: Sitting, BP Cuff Size: Adult)   Pulse 60   Temp 36.2 C (97.1 F) (Oral)    Ht 182.9 cm (6')   Wt 82.6 kg (182 lb)   BMI 24.68 kg/m    Focused Physical Exam: In no acute distress, breathing comfortably on room air Face symmetrically activating bilaterally .    Otomicroscopy: Due to the need to obtain an in-depth, binocular view of the deeper structures of the external auditory canal, tympanic membrane, and middle ear, the binocular microscope was utilized.  On the left, the external auditory canal was patent and with healthy appearing skin. Wax was absent. The tympanic membrane is mildly retracted, but mobile. The middle ear landmarks are appreciated and there is no perforation or effusion. The patient is able to autoinsufflate with nose-pinch valsalva.   On the right, the external auditory canal was patent and with healthy appearing skin. Wax was absent. This appears to be either a very small CWD cavity or canalplasty only.There is evidence of prior cartilage tympanoplasty with one piece of cartilage overlying the drum and another used to reconstruct the scutum. The TM is immobile and I cannot appreciate any middle ear structures.     Diagnosis for this encounter:      ICD-10-CM   1. 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 Mild sensorineural loss likely age-related. Candidate for bone conduction devices: Sentio, Bonebridge, Osia.  Prefers magnet type devices. Considering insurance coverage and device compatibility with Android phones. - Sent links to device information and videos. - Coordinate with manufacturers for further information if needed. - he is leaning toward Osia - Plan surgery in March, post-insurance switch to Medicare. Discussed r/b/a - Prescribe one week of antibiotics post-surgery. - Schedule follow-up for device  activation six weeks post-surgery.  History of bilateral middle ear cholesteatoma No recurrent cholesteatoma on recent CT scan. Previous surgeries addressed cholesteatoma with bone marrow grafting to obliterate mastoid cavity.  Chronic eustachian tube dysfunction Long-standing dysfunction with multiple tympanostomy tube placements. Current CT shows chronic ear disease consistent with dysfunction.    Patient Education:  Patient and family education and communication were provided [place] verbal communication. No barriers to education were identified. Patient, family, and/or caretaker verbally expressed understanding of information provided.   Time and Medical Decision Making:    Decision for Billing Based on Medical Decision Making (Needs 2 out of 3): NUMBER AND COMPLEXITY OF PROBLEMS ADDRESSED:  Level 5 High: 1 or more chronic illness with severe exacerbation/progression or side effect of treatment AMOUNT AND COMPLEXITY OF DATA TO BE REVIEWED:  Level 5 High (Needs 2/3 Categories): Category 2: Independent interpretation of tests RISK OF COMPLICATION/MORBIDITY/MORTALITY OF PATIENT MANAGEMENT Level 5 High: High Risk (Drug therapy w/ monitoring, Elective Surgery with risk factors, Emergency Major Surgery, Hospitalization, DNR decision)   Decision for Billing Based on Time:  Decision based on Medical Decision Making   Camellia Somerset, MD, MS Otology, Neurotology, and Lateral Skull Base Surgery
